# Patient Record
Sex: Female | Born: 1950 | Race: White | Hispanic: No | Marital: Married | Smoking: Former smoker
Health system: Southern US, Community
[De-identification: ages and names within clinical notes are randomized; demographics above are authoritative.]

## PROBLEM LIST (undated history)

## (undated) DIAGNOSIS — E782 Mixed hyperlipidemia: Secondary | ICD-10-CM

## (undated) DIAGNOSIS — M150 Primary generalized (osteo)arthritis: Secondary | ICD-10-CM

## (undated) DIAGNOSIS — C801 Malignant (primary) neoplasm, unspecified: Secondary | ICD-10-CM

## (undated) DIAGNOSIS — Z800 Family history of malignant neoplasm of digestive organs: Secondary | ICD-10-CM

## (undated) DIAGNOSIS — E669 Obesity, unspecified: Secondary | ICD-10-CM

## (undated) DIAGNOSIS — F419 Anxiety disorder, unspecified: Secondary | ICD-10-CM

## (undated) DIAGNOSIS — I10 Essential (primary) hypertension: Secondary | ICD-10-CM

## (undated) DIAGNOSIS — Z860100 Personal history of colon polyps, unspecified: Secondary | ICD-10-CM

## (undated) DIAGNOSIS — Z8719 Personal history of other diseases of the digestive system: Secondary | ICD-10-CM

## (undated) DIAGNOSIS — E559 Vitamin D deficiency, unspecified: Secondary | ICD-10-CM

## (undated) DIAGNOSIS — H2513 Age-related nuclear cataract, bilateral: Secondary | ICD-10-CM

## (undated) DIAGNOSIS — I6529 Occlusion and stenosis of unspecified carotid artery: Secondary | ICD-10-CM

## (undated) DIAGNOSIS — K219 Gastro-esophageal reflux disease without esophagitis: Secondary | ICD-10-CM

## (undated) DIAGNOSIS — M159 Polyosteoarthritis, unspecified: Secondary | ICD-10-CM

## (undated) HISTORY — DX: Anxiety disorder, unspecified: F41.9

## (undated) HISTORY — DX: Obesity, unspecified: E66.9

## (undated) HISTORY — DX: Family history of malignant neoplasm of digestive organs: Z80.0

## (undated) HISTORY — DX: Personal history of other diseases of the digestive system: Z87.19

## (undated) HISTORY — PX: UPPER GASTROINTESTINAL ENDOSCOPY: SHX188

## (undated) HISTORY — DX: Personal history of colon polyps, unspecified: Z86.0100

## (undated) HISTORY — PX: VAGINAL HYSTERECTOMY: SUR661

## (undated) HISTORY — DX: Polyosteoarthritis, unspecified: M15.9

## (undated) HISTORY — DX: Malignant (primary) neoplasm, unspecified: C80.1

## (undated) HISTORY — DX: Vitamin D deficiency, unspecified: E55.9

## (undated) HISTORY — DX: Essential (primary) hypertension: I10

## (undated) HISTORY — PX: APPENDECTOMY: SHX54

## (undated) HISTORY — DX: Gastro-esophageal reflux disease without esophagitis: K21.9

## (undated) HISTORY — PX: COLONOSCOPY: SHX174

## (undated) HISTORY — DX: Occlusion and stenosis of unspecified carotid artery: I65.29

## (undated) HISTORY — DX: Age-related nuclear cataract, bilateral: H25.13

## (undated) HISTORY — DX: Primary generalized (osteo)arthritis: M15.0

## (undated) HISTORY — DX: Personal history of colonic polyps: Z86.010

## (undated) HISTORY — DX: Mixed hyperlipidemia: E78.2

---

## 1997-11-08 ENCOUNTER — Other Ambulatory Visit: Admission: RE | Admit: 1997-11-08 | Discharge: 1997-11-08 | Payer: Self-pay | Admitting: Oncology

## 2004-04-07 HISTORY — PX: CHOLECYSTECTOMY: SHX55

## 2004-05-17 ENCOUNTER — Ambulatory Visit: Payer: Self-pay | Admitting: Oncology

## 2004-11-01 ENCOUNTER — Ambulatory Visit: Payer: Self-pay | Admitting: Oncology

## 2005-04-21 ENCOUNTER — Ambulatory Visit: Payer: Self-pay | Admitting: Oncology

## 2005-10-13 ENCOUNTER — Ambulatory Visit: Payer: Self-pay | Admitting: Oncology

## 2006-03-23 ENCOUNTER — Ambulatory Visit: Payer: Self-pay | Admitting: Oncology

## 2008-10-20 ENCOUNTER — Encounter: Payer: Self-pay | Admitting: Pulmonary Disease

## 2008-12-01 DIAGNOSIS — J310 Chronic rhinitis: Secondary | ICD-10-CM | POA: Insufficient documentation

## 2008-12-01 DIAGNOSIS — M1990 Unspecified osteoarthritis, unspecified site: Secondary | ICD-10-CM | POA: Insufficient documentation

## 2008-12-01 DIAGNOSIS — I10 Essential (primary) hypertension: Secondary | ICD-10-CM | POA: Insufficient documentation

## 2008-12-01 DIAGNOSIS — K219 Gastro-esophageal reflux disease without esophagitis: Secondary | ICD-10-CM

## 2008-12-01 DIAGNOSIS — E119 Type 2 diabetes mellitus without complications: Secondary | ICD-10-CM | POA: Insufficient documentation

## 2008-12-01 DIAGNOSIS — E785 Hyperlipidemia, unspecified: Secondary | ICD-10-CM

## 2008-12-01 DIAGNOSIS — R42 Dizziness and giddiness: Secondary | ICD-10-CM | POA: Insufficient documentation

## 2008-12-01 DIAGNOSIS — M7730 Calcaneal spur, unspecified foot: Secondary | ICD-10-CM | POA: Insufficient documentation

## 2008-12-01 DIAGNOSIS — F411 Generalized anxiety disorder: Secondary | ICD-10-CM | POA: Insufficient documentation

## 2008-12-01 DIAGNOSIS — M7660 Achilles tendinitis, unspecified leg: Secondary | ICD-10-CM | POA: Insufficient documentation

## 2008-12-04 ENCOUNTER — Ambulatory Visit: Payer: Self-pay | Admitting: Pulmonary Disease

## 2008-12-04 DIAGNOSIS — J984 Other disorders of lung: Secondary | ICD-10-CM

## 2008-12-04 DIAGNOSIS — C9140 Hairy cell leukemia not having achieved remission: Secondary | ICD-10-CM | POA: Insufficient documentation

## 2008-12-04 DIAGNOSIS — R05 Cough: Secondary | ICD-10-CM

## 2008-12-18 ENCOUNTER — Telehealth (INDEPENDENT_AMBULATORY_CARE_PROVIDER_SITE_OTHER): Payer: Self-pay | Admitting: *Deleted

## 2008-12-22 ENCOUNTER — Ambulatory Visit: Payer: Self-pay | Admitting: Pulmonary Disease

## 2009-01-05 ENCOUNTER — Ambulatory Visit: Payer: Self-pay | Admitting: Pulmonary Disease

## 2009-05-01 ENCOUNTER — Telehealth: Payer: Self-pay | Admitting: Pulmonary Disease

## 2009-05-11 ENCOUNTER — Encounter: Payer: Self-pay | Admitting: Pulmonary Disease

## 2009-05-17 ENCOUNTER — Encounter: Payer: Self-pay | Admitting: Pulmonary Disease

## 2009-08-27 HISTORY — PX: COLONOSCOPY: SHX5424

## 2009-08-27 HISTORY — PX: ESOPHAGOGASTRODUODENOSCOPY: SHX1529

## 2009-11-28 ENCOUNTER — Encounter: Payer: Self-pay | Admitting: Pulmonary Disease

## 2009-12-04 ENCOUNTER — Telehealth (INDEPENDENT_AMBULATORY_CARE_PROVIDER_SITE_OTHER): Payer: Self-pay | Admitting: *Deleted

## 2009-12-05 ENCOUNTER — Encounter: Payer: Self-pay | Admitting: Pulmonary Disease

## 2009-12-12 ENCOUNTER — Encounter: Payer: Self-pay | Admitting: Pulmonary Disease

## 2010-05-07 NOTE — Miscellaneous (Signed)
Summary: f/u ct chest stable.  Clinical Lists Changes  follow up ct chest shows no change in size of pulm nodules...completely stable from initial ct 10/2008 will check again in 6mos.  if stable, then one more at a year.  Appended Document: f/u ct chest stable. megan, please let pt know that ct chest completely stable.   good news....needs a f/u in 6mos.  call us in july to set up for august.  Appended Document: f/u ct chest stable. see above  Appended Document: f/u ct chest stable. Spoke with pt and made aware that ct chest completely stable and needs followup here in 6 months.  She will call in July to set up for August.

## 2010-05-07 NOTE — Progress Notes (Signed)
Summary: chest ct  Phone Note Call from Patient   Caller: Judith Matthews Call For: clance Summary of Call: pt said you told her to call end of dec to set up ct in jan 11 i need order and she wants it at [facility]  Initial call taken by: Oneita Jolly,  May 01, 2009 10:42 AM  Follow-up for Phone Call        ok.  will send order to [facility]. Follow-up by: Barbaraann Share MD,  May 02, 2009 5:32 PM

## 2010-05-07 NOTE — Miscellaneous (Signed)
Summary: Orders Update   Clinical Lists Changes  Orders: Added new Referral order of Radiology Referral (Radiology) - Signed 

## 2010-05-07 NOTE — Progress Notes (Signed)
Summary: signed order  Phone Note From Other Clinic Call back at [phone]   Caller: amanda/[facility] Call For: clance Summary of Call: States they received an order from [facility] with a stamped signature, needs to have his actual signature, needs it by 2p today in order to keep pt on schedule for tomorrow. Initial call taken by: Darletta Moll,  December 04, 2009 11:20 AM  Follow-up for Phone Call        order for CT chest sent by [facility].  will forward message to [facility] inbox. Boone Master CNA/MA  December 04, 2009 11:24 AM   Additional Follow-up for Phone Call Additional follow up Details #1::        spoke to [facility] that is dr clance original sig i had him to sign it myself she was ok with this Additional Follow-up by: Oneita Jolly,  December 04, 2009 11:27 AM

## 2010-05-07 NOTE — Miscellaneous (Signed)
Summary: ct chest 11/2009 stable nodules.  Clinical Lists Changes  ct chest 12/05/2009 shows stable nodules.  needs one more f/u in a year.  Appended Document: ct chest 11/2009 stable nodules. megan. please let pt know ct chest stable.  needs one more f/u in a year...please call next july for [place] to schedule in [place].  Appended Document: ct chest 11/2009 stable nodules. called and spoke with pt.  pt aware ct chest stable.  pt aware of KC's instructions to call back in 1 year (next July) to set up f/u ct appt. pt verbalized understanding and denied any questions.

## 2016-10-15 ENCOUNTER — Other Ambulatory Visit: Payer: Self-pay | Admitting: Specialist

## 2016-10-15 DIAGNOSIS — Z1231 Encounter for screening mammogram for malignant neoplasm of breast: Secondary | ICD-10-CM

## 2016-10-28 ENCOUNTER — Ambulatory Visit: Payer: Self-pay

## 2016-10-29 ENCOUNTER — Ambulatory Visit (INDEPENDENT_AMBULATORY_CARE_PROVIDER_SITE_OTHER): Payer: Medicare Other | Admitting: Orthopaedic Surgery

## 2016-10-29 DIAGNOSIS — M65341 Trigger finger, right ring finger: Secondary | ICD-10-CM | POA: Diagnosis not present

## 2016-10-29 MED ORDER — LIDOCAINE HCL 1 % IJ SOLN
1.0000 mL | INTRAMUSCULAR | Status: AC | PRN
  Administered 2016-10-29: 1 mL

## 2016-10-29 MED ORDER — METHYLPREDNISOLONE ACETATE 40 MG/ML IJ SUSP
40.0000 mg | INTRAMUSCULAR | Status: AC | PRN
  Administered 2016-10-29: 40 mg

## 2016-10-29 NOTE — Progress Notes (Signed)
   Office Visit Note   Patient: Judith Matthews           Date of Birth: 09-20-1950           MRN: [id number] Visit Date: 10/29/2016              Requested by: No referring provider defined for this encounter. PCP: System, Pcp Not In   Assessment & Plan: Visit Diagnoses:  1. Trigger finger, right ring finger     Plan: She was to consider 1 more injection I agree with this. We placed a steroid injection in the A1 pulley of her right middle finger combined with lidocaine. She'll follow-up as needed. However if this is still persistently giving her problems my next step would be an A1 pulley release with a long and thorough discussion about the surgery as well as the risks and benefits of it. All questions were encouraged and answered.  Follow-Up Instructions: Return if symptoms worsen or fail to improve.   Orders:  No orders of the defined types were placed in this encounter.  No orders of the defined types were placed in this encounter.     Procedures: Hand/UE Inj Date/Time: 10/29/2016 11:04 AM Performed by: Mcarthur Rossetti Authorized by: Mcarthur Rossetti   Condition: trigger finger   Location:  Ring finger Site:  R ring A1 Medications:  1 mL lidocaine 1 %; 40 mg methylPREDNISolone acetate 40 MG/ML     Clinical Data: No additional findings.   Subjective: No chief complaint on file. The patient comes in today with chief complaint of triggering of her right ring finger. 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 It triggers on a daily basis. She first had injection about 15 years ago helped for 15 years in the last year had another injection. Is back to triggering again and has have some pain in her right middle finger in the morning. Standing.  HPI  Review of Systems She denies any headache, chest pain, short of breath, fever, chills, nausea, vomiting.  Objective: Vital Signs: There were no vitals taken for this  visit.  Physical Exam He is alert and oriented 3 and in no acute distress Ortho Exam Emanation of her right hand does show pain over the A1 pulley of the ring finger. She is neurovascular intact otherwise. Her fingers ligamentously stable. Specialty Comments:  No specialty comments available.  Imaging: No results found.   PMFS History: Patient Active Problem List   Diagnosis Date Noted  . Trigger finger, right ring finger 10/29/2016  . LEUKEMIA, HAIRY CELL 12/04/2008  . PULMONARY NODULE 12/04/2008  . COUGH 12/04/2008  . DIABETES MELLITUS 12/01/2008  . HYPERLIPIDEMIA 12/01/2008  . OBESITY, MORBID 12/01/2008  . ANXIETY 12/01/2008  . HYPERTENSION 12/01/2008  . RHINITIS 12/01/2008  . GERD 12/01/2008  . OSTEOARTHRITIS 12/01/2008  . ACHILLES TENDINITIS 12/01/2008  . CALCANEAL SPUR 12/01/2008  . DIZZINESS 12/01/2008   No past medical history on file.  No family history on file.  No past surgical history on file. Social History   Occupational History  . Not on file.   Social History Main Topics  . Smoking status: Not on file  . Smokeless tobacco: Not on file  . Alcohol use Not on file  . Drug use: Unknown  . Sexual activity: Not on file

## 2016-10-30 ENCOUNTER — Ambulatory Visit
Admission: RE | Admit: 2016-10-30 | Discharge: 2016-10-30 | Disposition: A | Discharge status: Home / Self Care | Payer: Medicare Other | Source: Ambulatory Visit | Attending: Specialist | Admitting: Specialist

## 2016-10-30 ENCOUNTER — Encounter: Payer: Self-pay | Admitting: Radiology

## 2016-10-30 DIAGNOSIS — Z1231 Encounter for screening mammogram for malignant neoplasm of breast: Secondary | ICD-10-CM

## 2017-10-07 ENCOUNTER — Other Ambulatory Visit: Payer: Self-pay | Admitting: Specialist

## 2017-10-07 DIAGNOSIS — Z1231 Encounter for screening mammogram for malignant neoplasm of breast: Secondary | ICD-10-CM

## 2017-10-14 ENCOUNTER — Ambulatory Visit (INDEPENDENT_AMBULATORY_CARE_PROVIDER_SITE_OTHER): Payer: Medicare Other | Admitting: Orthopaedic Surgery

## 2017-10-14 ENCOUNTER — Encounter (INDEPENDENT_AMBULATORY_CARE_PROVIDER_SITE_OTHER): Payer: Self-pay | Admitting: Orthopaedic Surgery

## 2017-10-14 DIAGNOSIS — M65332 Trigger finger, left middle finger: Secondary | ICD-10-CM

## 2017-10-14 MED ORDER — METHYLPREDNISOLONE ACETATE 40 MG/ML IJ SUSP
40.0000 mg | INTRAMUSCULAR | Status: AC | PRN
  Administered 2017-10-14: 40 mg

## 2017-10-14 MED ORDER — LIDOCAINE HCL 1 % IJ SOLN
1.0000 mL | INTRAMUSCULAR | Status: AC | PRN
  Administered 2017-10-14: 1 mL

## 2017-10-14 NOTE — Progress Notes (Signed)
   Office Visit Note   Patient: Judith Matthews           Date of Birth: Aug 10, 1950           MRN: [id number] Visit Date: 10/14/2017              Requested by: No referring provider defined for this encounter. PCP: System, Pcp Not In   Assessment & Plan: Visit Diagnoses:  1. Trigger finger, left middle finger     Plan: I agree with trying a steroid injection in her left hand A1 pulley of the middle finger.  She understands this could elevate her blood glucose further and she can watch this closely.  All the risk and benefits of this were explained in detail.  She tolerated the injection well.  All questions concerns were answered and addressed.  Follow-up will be as needed.  Follow-Up Instructions: Return if symptoms worsen or fail to improve.   Orders:  No orders of the defined types were placed in this encounter.  No orders of the defined types were placed in this encounter.     Procedures: Hand/UE Inj: L long A1 for trigger finger on 10/14/2017 1:54 PM Medications: 1 mL lidocaine 1 %; 40 mg methylPREDNISolone acetate 40 MG/ML      Clinical Data: No additional findings.   Subjective: Chief Complaint  Patient presents with  . Left Hand - Pain  The patient is a 67 year old well-known to me.  She comes in the review issue.  She developed triggering of her left hand ring finger.  She is a diabetic and says her blood sugars have been running a little high.  She has had triggering before on her other hand and this resolved after steroid injection.  She would like to have one today in her left hand middle finger.  She denies any numbness and tingling or any injuries.  It does trigger at night when she gets up in the morning as well.  HPI  Review of Systems She currently denies any headache, chest pain, shortness of breath, fever, chills, nausea, vomiting.  Objective: Vital Signs: There were no vitals taken for this visit.  Physical Exam She is alert and oriented x3  and in no acute distress Ortho Exam Examination of her left hand shows pain over the A1 pulley with active triggering of the middle finger.  The remainder of her hand exam is normal.  Specialty Comments:  No specialty comments available.  Imaging: No results found.   PMFS History: Patient Active Problem List   Diagnosis Date Noted  . Trigger finger, right ring finger 10/29/2016  . LEUKEMIA, HAIRY CELL 12/04/2008  . PULMONARY NODULE 12/04/2008  . COUGH 12/04/2008  . DIABETES MELLITUS 12/01/2008  . HYPERLIPIDEMIA 12/01/2008  . OBESITY, MORBID 12/01/2008  . ANXIETY 12/01/2008  . HYPERTENSION 12/01/2008  . RHINITIS 12/01/2008  . GERD 12/01/2008  . OSTEOARTHRITIS 12/01/2008  . ACHILLES TENDINITIS 12/01/2008  . CALCANEAL SPUR 12/01/2008  . DIZZINESS 12/01/2008   History reviewed. No pertinent past medical history.  History reviewed. No pertinent family history.  History reviewed. No pertinent surgical history. Social History   Occupational History  . Not on file  Tobacco Use  . Smoking status: Not on file  Substance and Sexual Activity  . Alcohol use: Not on file  . Drug use: Not on file  . Sexual activity: Not on file

## 2017-11-02 ENCOUNTER — Ambulatory Visit
Admission: RE | Admit: 2017-11-02 | Discharge: 2017-11-02 | Disposition: A | Discharge status: Home / Self Care | Payer: Medicare Other | Source: Ambulatory Visit | Attending: Specialist | Admitting: Specialist

## 2017-11-02 DIAGNOSIS — Z1231 Encounter for screening mammogram for malignant neoplasm of breast: Secondary | ICD-10-CM

## 2019-01-19 ENCOUNTER — Other Ambulatory Visit: Payer: Self-pay | Admitting: Specialist

## 2019-01-19 DIAGNOSIS — Z1231 Encounter for screening mammogram for malignant neoplasm of breast: Secondary | ICD-10-CM

## 2019-03-09 ENCOUNTER — Other Ambulatory Visit: Payer: Self-pay

## 2019-03-09 ENCOUNTER — Ambulatory Visit
Admission: RE | Admit: 2019-03-09 | Discharge: 2019-03-09 | Disposition: A | Discharge status: Home / Self Care | Payer: Medicare Other | Source: Ambulatory Visit | Attending: Specialist | Admitting: Specialist

## 2019-03-09 DIAGNOSIS — Z1231 Encounter for screening mammogram for malignant neoplasm of breast: Secondary | ICD-10-CM

## 2019-06-09 DIAGNOSIS — D72819 Decreased white blood cell count, unspecified: Secondary | ICD-10-CM

## 2019-06-09 DIAGNOSIS — C9141 Hairy cell leukemia, in remission: Secondary | ICD-10-CM | POA: Diagnosis not present

## 2019-06-09 DIAGNOSIS — D6959 Other secondary thrombocytopenia: Secondary | ICD-10-CM

## 2020-01-27 ENCOUNTER — Other Ambulatory Visit: Payer: Self-pay | Admitting: Specialist

## 2020-01-27 DIAGNOSIS — Z1231 Encounter for screening mammogram for malignant neoplasm of breast: Secondary | ICD-10-CM

## 2020-03-12 ENCOUNTER — Other Ambulatory Visit: Payer: Self-pay

## 2020-03-12 ENCOUNTER — Ambulatory Visit
Admission: RE | Admit: 2020-03-12 | Discharge: 2020-03-12 | Disposition: A | Discharge status: Home / Self Care | Payer: Medicare Other | Source: Ambulatory Visit | Attending: Specialist | Admitting: Specialist

## 2020-03-12 DIAGNOSIS — Z1231 Encounter for screening mammogram for malignant neoplasm of breast: Secondary | ICD-10-CM

## 2020-08-06 DIAGNOSIS — Z856 Personal history of leukemia: Secondary | ICD-10-CM | POA: Diagnosis not present

## 2020-08-06 DIAGNOSIS — K219 Gastro-esophageal reflux disease without esophagitis: Secondary | ICD-10-CM | POA: Diagnosis not present

## 2020-08-06 DIAGNOSIS — F330 Major depressive disorder, recurrent, mild: Secondary | ICD-10-CM | POA: Diagnosis not present

## 2020-08-06 DIAGNOSIS — I10 Essential (primary) hypertension: Secondary | ICD-10-CM | POA: Diagnosis not present

## 2020-08-06 DIAGNOSIS — Z6837 Body mass index (BMI) 37.0-37.9, adult: Secondary | ICD-10-CM | POA: Diagnosis not present

## 2020-08-06 DIAGNOSIS — Z7984 Long term (current) use of oral hypoglycemic drugs: Secondary | ICD-10-CM | POA: Diagnosis not present

## 2020-08-06 DIAGNOSIS — Z79899 Other long term (current) drug therapy: Secondary | ICD-10-CM | POA: Diagnosis not present

## 2020-08-06 DIAGNOSIS — E782 Mixed hyperlipidemia: Secondary | ICD-10-CM | POA: Diagnosis not present

## 2020-08-06 DIAGNOSIS — E119 Type 2 diabetes mellitus without complications: Secondary | ICD-10-CM | POA: Diagnosis not present

## 2020-08-06 DIAGNOSIS — E559 Vitamin D deficiency, unspecified: Secondary | ICD-10-CM | POA: Diagnosis not present

## 2020-08-06 DIAGNOSIS — F419 Anxiety disorder, unspecified: Secondary | ICD-10-CM | POA: Diagnosis not present

## 2020-08-06 DIAGNOSIS — E538 Deficiency of other specified B group vitamins: Secondary | ICD-10-CM | POA: Diagnosis not present

## 2020-08-06 DIAGNOSIS — Z0000 Encounter for general adult medical examination without abnormal findings: Secondary | ICD-10-CM | POA: Diagnosis not present

## 2020-08-06 DIAGNOSIS — Z87891 Personal history of nicotine dependence: Secondary | ICD-10-CM | POA: Diagnosis not present

## 2020-08-06 DIAGNOSIS — I872 Venous insufficiency (chronic) (peripheral): Secondary | ICD-10-CM | POA: Diagnosis not present

## 2020-08-06 DIAGNOSIS — M159 Polyosteoarthritis, unspecified: Secondary | ICD-10-CM | POA: Diagnosis not present

## 2020-08-08 DIAGNOSIS — E538 Deficiency of other specified B group vitamins: Secondary | ICD-10-CM | POA: Diagnosis not present

## 2020-09-10 DIAGNOSIS — E538 Deficiency of other specified B group vitamins: Secondary | ICD-10-CM | POA: Diagnosis not present

## 2020-10-11 DIAGNOSIS — E538 Deficiency of other specified B group vitamins: Secondary | ICD-10-CM | POA: Diagnosis not present

## 2020-11-12 DIAGNOSIS — E538 Deficiency of other specified B group vitamins: Secondary | ICD-10-CM | POA: Diagnosis not present

## 2020-11-26 DIAGNOSIS — M65331 Trigger finger, right middle finger: Secondary | ICD-10-CM | POA: Diagnosis not present

## 2020-12-13 DIAGNOSIS — Z23 Encounter for immunization: Secondary | ICD-10-CM | POA: Diagnosis not present

## 2020-12-13 DIAGNOSIS — E538 Deficiency of other specified B group vitamins: Secondary | ICD-10-CM | POA: Diagnosis not present

## 2021-01-14 DIAGNOSIS — E538 Deficiency of other specified B group vitamins: Secondary | ICD-10-CM | POA: Diagnosis not present

## 2021-02-07 DIAGNOSIS — E782 Mixed hyperlipidemia: Secondary | ICD-10-CM | POA: Diagnosis not present

## 2021-02-07 DIAGNOSIS — Z856 Personal history of leukemia: Secondary | ICD-10-CM | POA: Diagnosis not present

## 2021-02-07 DIAGNOSIS — M159 Polyosteoarthritis, unspecified: Secondary | ICD-10-CM | POA: Diagnosis not present

## 2021-02-07 DIAGNOSIS — R7401 Elevation of levels of liver transaminase levels: Secondary | ICD-10-CM | POA: Diagnosis not present

## 2021-02-07 DIAGNOSIS — F419 Anxiety disorder, unspecified: Secondary | ICD-10-CM | POA: Diagnosis not present

## 2021-02-07 DIAGNOSIS — K1379 Other lesions of oral mucosa: Secondary | ICD-10-CM | POA: Diagnosis not present

## 2021-02-07 DIAGNOSIS — Z6837 Body mass index (BMI) 37.0-37.9, adult: Secondary | ICD-10-CM | POA: Diagnosis not present

## 2021-02-07 DIAGNOSIS — K219 Gastro-esophageal reflux disease without esophagitis: Secondary | ICD-10-CM | POA: Diagnosis not present

## 2021-02-07 DIAGNOSIS — I872 Venous insufficiency (chronic) (peripheral): Secondary | ICD-10-CM | POA: Diagnosis not present

## 2021-02-07 DIAGNOSIS — R799 Abnormal finding of blood chemistry, unspecified: Secondary | ICD-10-CM | POA: Diagnosis not present

## 2021-02-07 DIAGNOSIS — E119 Type 2 diabetes mellitus without complications: Secondary | ICD-10-CM | POA: Diagnosis not present

## 2021-02-07 DIAGNOSIS — I10 Essential (primary) hypertension: Secondary | ICD-10-CM | POA: Diagnosis not present

## 2021-02-08 ENCOUNTER — Other Ambulatory Visit: Payer: Self-pay | Admitting: Specialist

## 2021-02-08 DIAGNOSIS — Z1231 Encounter for screening mammogram for malignant neoplasm of breast: Secondary | ICD-10-CM

## 2021-02-14 DIAGNOSIS — E538 Deficiency of other specified B group vitamins: Secondary | ICD-10-CM | POA: Diagnosis not present

## 2021-02-26 ENCOUNTER — Encounter: Payer: Self-pay | Admitting: Gastroenterology

## 2021-03-08 ENCOUNTER — Telehealth: Payer: Self-pay

## 2021-03-08 NOTE — Telephone Encounter (Signed)
LVM for patient to call back  She needs to reschedule her appointment due to schedule conflict.

## 2021-03-11 DIAGNOSIS — R059 Cough, unspecified: Secondary | ICD-10-CM | POA: Diagnosis not present

## 2021-03-11 DIAGNOSIS — J40 Bronchitis, not specified as acute or chronic: Secondary | ICD-10-CM | POA: Diagnosis not present

## 2021-03-11 DIAGNOSIS — Z20822 Contact with and (suspected) exposure to covid-19: Secondary | ICD-10-CM | POA: Diagnosis not present

## 2021-03-11 NOTE — Telephone Encounter (Signed)
Patient rescheduled for 12-29

## 2021-03-21 ENCOUNTER — Ambulatory Visit
Admission: RE | Admit: 2021-03-21 | Discharge: 2021-03-21 | Disposition: A | Discharge status: Home / Self Care | Payer: PPO | Source: Ambulatory Visit | Attending: Specialist | Admitting: Specialist

## 2021-03-21 DIAGNOSIS — Z1231 Encounter for screening mammogram for malignant neoplasm of breast: Secondary | ICD-10-CM | POA: Diagnosis not present

## 2021-03-26 DIAGNOSIS — E538 Deficiency of other specified B group vitamins: Secondary | ICD-10-CM | POA: Diagnosis not present

## 2021-03-29 ENCOUNTER — Ambulatory Visit: Payer: Medicare Other | Admitting: Gastroenterology

## 2021-04-03 ENCOUNTER — Other Ambulatory Visit: Payer: Self-pay

## 2021-04-04 ENCOUNTER — Encounter: Payer: Self-pay | Admitting: Gastroenterology

## 2021-04-04 ENCOUNTER — Ambulatory Visit (INDEPENDENT_AMBULATORY_CARE_PROVIDER_SITE_OTHER): Payer: PPO | Admitting: Gastroenterology

## 2021-04-04 ENCOUNTER — Other Ambulatory Visit: Payer: Self-pay

## 2021-04-04 VITALS — BP 160/82 | HR 70 | Ht 63.5 in | Wt 205.4 lb

## 2021-04-04 DIAGNOSIS — R1010 Upper abdominal pain, unspecified: Secondary | ICD-10-CM | POA: Diagnosis not present

## 2021-04-04 DIAGNOSIS — K580 Irritable bowel syndrome with diarrhea: Secondary | ICD-10-CM | POA: Diagnosis not present

## 2021-04-04 DIAGNOSIS — Z800 Family history of malignant neoplasm of digestive organs: Secondary | ICD-10-CM | POA: Diagnosis not present

## 2021-04-04 DIAGNOSIS — K219 Gastro-esophageal reflux disease without esophagitis: Secondary | ICD-10-CM | POA: Diagnosis not present

## 2021-04-04 MED ORDER — PANTOPRAZOLE SODIUM 40 MG PO TBEC: 40.0000 mg | DELAYED_RELEASE_TABLET | Freq: Every day | ORAL | 3 refills | Status: DC

## 2021-04-04 NOTE — Progress Notes (Signed)
Chief Complaint: For GI work-up  Referring Provider:  No ref. provider found      ASSESSMENT AND PLAN;   #1. GERD with upper abdo pain. S/P lap chole.  #2. FH colon ca (sister at age 71)  #3. Diarrhea. 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 -Nexium to protonix 40mg  po QD #30, 11 RF -If still with problems, [place] followed by CT AP -Check CBC, CMP, lipase at the time of above procedures.    HPI:    Judith Matthews is a 70 y.o. female  With DM2, HTN, anxiety  With Upper abdo pain despite OTC Nexium without any N/V.  Gets worse after eating.  No odynophagia or dysphagia.  No nonsteroidals.  No fever chills or night sweats.  No jaundice dark urine or pale stools.  Occ diarrhea ever since lap chole and while being on metformin.  She is due for repeat colonoscopy.  No weight loss or loss of appetite.  Past GI procedures: EGD 08/2009 -Antral polyp s/p polypectomy. Bx-foveolar cell hyperplasia -Mild gastritis. Neg HP  Colonoscopy 08/2009 ([facility]) -Mild pancolonic diverticulosis -Small internal hemorrhoids -Repeat in 5 years d/t FH  CT 06/2008: Acute appendicitis, hepatic steatosis.  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   Past Medical History:  Diagnosis Date   Family history of colon cancer    GERD (gastroesophageal reflux disease)    with chronic cough   History of colon polyps    History of GI bleed    History of hemorrhoids    Obesity     Past Surgical History:  Procedure Laterality Date   APPENDECTOMY     CHOLECYSTECTOMY  2006   laparoscopic due to stones   COLONOSCOPY  08/27/2009   Mild pancolonic diverticulosis. Small internal hemorrhoids.   ESOPHAGOGASTRODUODENOSCOPY  08/27/2009   Antral polyp status post polypectomy. Mild gastritis   VAGINAL HYSTERECTOMY      Family History  Problem Relation Age of Onset   COPD Mother    Diabetes Mother    Heart disease Mother    COPD Father    Colon cancer  Sister    Rectal cancer Neg Hx    Stomach cancer Neg Hx        Current Outpatient Medications  Medication Sig Dispense Refill   busPIRone (BUSPAR) 5 MG tablet Take 5 mg by mouth 2 (two) times daily.     cyanocobalamin (,VITAMIN B-12,) 1000 MCG/ML injection Inject into the muscle.     dexamethasone (DECADRON) 0.5 MG/5ML solution Take by mouth.     ergocalciferol (VITAMIN D2) 1.25 MG (50000 UT) capsule TAKE 1 CAPSULE BY MOUTH TWICE A WEEK     losartan (COZAAR) 50 MG tablet Take 1 tablet by mouth daily.     metFORMIN (GLUCOPHAGE-XR) 500 MG 24 hr tablet Take 500 mg by mouth 2 (two) times daily.     sitaGLIPtin (JANUVIA) 100 MG tablet Take 1 tablet by mouth daily.     No current facility-administered medications for this visit.    Allergies  Allergen Reactions   Senna-Lax     REACTION: nausea, vomiting    Review of Systems:  Constitutional: Denies fever, chills, diaphoresis, appetite change and fatigue.  HEENT: Denies photophobia, eye pain, redness, hearing loss, ear pain, congestion, sore throat, rhinorrhea, sneezing, mouth sores, neck pain, neck stiffness and tinnitus.   Respiratory: Denies SOB, DOE, cough, chest tightness,  and wheezing.   Cardiovascular: Denies chest pain, palpitations and  leg swelling.  Genitourinary: Denies dysuria, urgency, frequency, hematuria, flank pain and difficulty urinating.  Musculoskeletal: Denies myalgias, back pain, joint swelling, arthralgias and gait problem.  Skin: No rash.  Neurological: Denies dizziness, seizures, syncope, weakness, light-headedness, numbness and headaches.  Hematological: Denies adenopathy. Easy bruising, personal or family bleeding history  Psychiatric/Behavioral: Has anxiety or depression     Physical Exam:    BP (!) 160/82    Pulse 70    Ht 5' 3.5" (1.613 m)    Wt 205 lb 6 oz (93.2 kg)    SpO2 98%    BMI 35.81 kg/m  Wt Readings from Last 3 Encounters:  04/04/21 205 lb 6 oz (93.2 kg)   Constitutional:   Well-developed, in no acute distress. Psychiatric: Normal mood and affect. Behavior is normal. HEENT: Pupils normal.  Conjunctivae are normal. No scleral icterus. Neck supple.  Cardiovascular: Normal rate, regular rhythm. No edema Pulmonary/chest: Effort normal and breath sounds normal. No wheezing, rales or rhonchi. Abdominal: Soft, nondistended. Nontender. Bowel sounds active throughout. There are no masses palpable. No hepatomegaly. Rectal: Deferred Neurological: Alert and oriented to person place and time. Skin: Skin is warm and dry. No rashes noted.  Data Reviewed: I have personally reviewed following labs and imaging studies   Radiology Studies: MM 3D SCREEN BREAST BILATERAL  Result Date: 03/21/2021 CLINICAL DATA:  Screening. EXAM: DIGITAL SCREENING BILATERAL MAMMOGRAM WITH TOMOSYNTHESIS AND CAD TECHNIQUE: Bilateral screening digital craniocaudal and mediolateral oblique mammograms were obtained. Bilateral screening digital breast tomosynthesis was performed. The images were evaluated with computer-aided detection. COMPARISON:  Previous exam(s). ACR Breast Density Category b: There are scattered areas of fibroglandular density. FINDINGS: There are no findings suspicious for malignancy. IMPRESSION: No mammographic evidence of malignancy. A result letter of this screening mammogram will be mailed directly to the patient. RECOMMENDATION: Screening mammogram in one year. (Code:SM-B-01Y) BI-RADS CATEGORY  1: Negative. Electronically Signed   By: Lajean Manes M.D.   On: 03/21/2021 14:21      Carmell Austria, MD 04/04/2021, 11:19 AM  Cc: No ref. provider found

## 2021-04-04 NOTE — Patient Instructions (Addendum)
If you are age 71 or older, your body mass index should be between 23-30. Your Body mass index is 35.81 kg/m. If this is out of the aforementioned range listed, please consider follow up with your Primary Care Provider.  If you are age 31 or younger, your body mass index should be between 19-25. Your Body mass index is 35.81 kg/m. If this is out of the aformentioned range listed, please consider follow up with your Primary Care Provider.   ________________________________________________________  The [facility] providers would like to encourage you to use [facility] to communicate with providers for non-urgent requests or questions.  Due to long hold times on the telephone, sending your provider a message by [facility] may be a faster and more efficient way to get a response.  Please allow 48 business hours for a response.  Please remember that this is for non-urgent requests.  _______________________________________________________  Judith Matthews have been scheduled for an endoscopy and colonoscopy. Please follow the written instructions given to you at your visit today. Please pick up your prep supplies at the pharmacy within the next 1-3 days. If you use inhalers (even only as needed), please bring them with you on the day of your procedure.  We have sent the following medications to your pharmacy for you to pick up at your convenience: Protonix  Stop nexium  Thank you,  Dr. Jackquline Denmark

## 2021-05-21 ENCOUNTER — Encounter: Payer: Self-pay | Admitting: Gastroenterology

## 2021-05-21 ENCOUNTER — Ambulatory Visit (AMBULATORY_SURGERY_CENTER): Payer: PPO | Admitting: Gastroenterology

## 2021-05-21 VITALS — BP 154/86 | HR 76 | Temp 98.4°F | Resp 20 | Ht 63.5 in | Wt 205.0 lb

## 2021-05-21 DIAGNOSIS — K2970 Gastritis, unspecified, without bleeding: Secondary | ICD-10-CM | POA: Diagnosis not present

## 2021-05-21 DIAGNOSIS — D123 Benign neoplasm of transverse colon: Secondary | ICD-10-CM

## 2021-05-21 DIAGNOSIS — Z800 Family history of malignant neoplasm of digestive organs: Secondary | ICD-10-CM

## 2021-05-21 DIAGNOSIS — K219 Gastro-esophageal reflux disease without esophagitis: Secondary | ICD-10-CM | POA: Diagnosis not present

## 2021-05-21 DIAGNOSIS — K5730 Diverticulosis of large intestine without perforation or abscess without bleeding: Secondary | ICD-10-CM | POA: Diagnosis not present

## 2021-05-21 DIAGNOSIS — D125 Benign neoplasm of sigmoid colon: Secondary | ICD-10-CM | POA: Diagnosis not present

## 2021-05-21 DIAGNOSIS — K635 Polyp of colon: Secondary | ICD-10-CM | POA: Diagnosis not present

## 2021-05-21 DIAGNOSIS — K640 First degree hemorrhoids: Secondary | ICD-10-CM

## 2021-05-21 DIAGNOSIS — R197 Diarrhea, unspecified: Secondary | ICD-10-CM

## 2021-05-21 MED ORDER — SODIUM CHLORIDE 0.9 % IV SOLN: 500.0000 mL | Freq: Once | INTRAVENOUS | Status: DC

## 2021-05-21 NOTE — Progress Notes (Signed)
1525 HR > 100 with esmolol 25 mg given IV.  Patient experiencing nausea.  MD updated and Zofran 4 mg IV given, vss

## 2021-05-21 NOTE — Op Note (Signed)
[name] Patient Name: Judith Matthews Procedure Date: 05/21/2021 3:00 PM MRN: [id number] Endoscopist: Jackquline Denmark , MD Age: 71 Referring MD:  Date of Birth: [birth year] Gender: Female Account #: [id number] Procedure:                Upper GI endoscopy Indications:              Epigastric abdominal pain. GERD. Medicines:                Monitored Anesthesia Care Procedure:                Pre-Anesthesia Assessment:                           - Prior to the procedure, a History and Physical                            was performed, and patient medications and                            allergies were reviewed. The patient's tolerance of                            previous anesthesia was also reviewed. The risks                            and benefits of the procedure and the sedation                            options and risks were discussed with the patient.                            All questions were answered, and informed consent                            was obtained. Prior Anticoagulants: The patient has                            taken no previous anticoagulant or antiplatelet                            agents. ASA Grade Assessment: II - A patient with                            mild systemic disease. After reviewing the risks                            and benefits, the patient was deemed in                            satisfactory condition to undergo the procedure.                           After obtaining informed consent, the endoscope was  passed under direct vision. Throughout the                            procedure, the patient's blood pressure, pulse, and                            oxygen saturations were monitored continuously. The                            Endoscope was introduced through the mouth, and                            advanced to the second part of duodenum. The upper                            GI endoscopy was  accomplished without difficulty.                            The patient tolerated the procedure well. Scope In: Scope Out: Findings:                 The examined esophagus was normal, with                            well-defined Z-line at 38 cm, examined by NBI.                           Localized mild inflammation characterized by                            erythema and granularity was found in the gastric                            antrum. Biopsies were taken with a cold forceps for                            histology. 4-5 small 4 to 6 mm gastric polyps were                            noted (previously deemed to be hyperplastic). These                            were not removed.                           The examined duodenum was normal. Biopsies for                            histology were taken with a cold forceps for                            evaluation of celiac disease. Complications:            No immediate complications. Estimated Blood Loss:     Estimated blood loss: none.  Impression:               - Mild gastritis. Recommendation:           - Patient has a contact number available for                            emergencies. The signs and symptoms of potential                            delayed complications were discussed with the                            patient. Return to normal activities tomorrow.                            Written discharge instructions were provided to the                            patient.                           - Resume previous diet.                           - Continue present medications.                           - Await pathology results.                           - The findings and recommendations were discussed                            with the patient. Jackquline Denmark, MD 05/21/2021 3:10:13 PM This report has been signed electronically.

## 2021-05-21 NOTE — Patient Instructions (Signed)
Handouts Provided:  Gastritis, Polyps and Diverticulosis  YOU HAD AN ENDOSCOPIC PROCEDURE TODAY AT [facility]:   Refer to the procedure report that was given to you for any specific questions about what was found during the examination.  If the procedure report does not answer your questions, please call your gastroenterologist to clarify.  If you requested that your care partner not be given the details of your procedure findings, then the procedure report has been included in a sealed envelope for you to review at your convenience later.  YOU SHOULD EXPECT: Some feelings of bloating in the abdomen. Passage of more gas than usual.  Walking can help get rid of the air that was put into your GI tract during the procedure and reduce the bloating. If you had a lower endoscopy (such as a colonoscopy or flexible sigmoidoscopy) you may notice spotting of blood in your stool or on the toilet paper. If you underwent a bowel prep for your procedure, you may not have a normal bowel movement for a few days.  Please Note:  You might notice some irritation and congestion in your nose or some drainage.  This is from the oxygen used during your procedure.  There is no need for concern and it should clear up in a day or so.  SYMPTOMS TO REPORT IMMEDIATELY:  Following lower endoscopy (colonoscopy or flexible sigmoidoscopy):  Excessive amounts of blood in the stool  Significant tenderness or worsening of abdominal pains  Swelling of the abdomen that is new, acute  Fever of 100F or higher  Following upper endoscopy (EGD)  Vomiting of blood or coffee ground material  New chest pain or pain under the shoulder blades  Painful or persistently difficult swallowing  New shortness of breath  Fever of 100F or higher  Black, tarry-looking stools  For urgent or emergent issues, a gastroenterologist can be reached at any hour by calling [phone]. Do not use MyChart messaging for urgent concerns.     DIET:  We do recommend a small meal at first, but then you may proceed to your regular diet.  Drink plenty of fluids but you should avoid alcoholic beverages for 24 hours.  ACTIVITY:  You should plan to take it easy for the rest of today and you should NOT DRIVE or use heavy machinery until tomorrow (because of the sedation medicines used during the test).    FOLLOW UP: Our staff will call the number listed on your records 48-72 hours following your procedure to check on you and address any questions or concerns that you may have regarding the information given to you following your procedure. If we do not reach you, we will leave a message.  We will attempt to reach you two times.  During this call, we will ask if you have developed any symptoms of COVID 19. If you develop any symptoms (ie: fever, flu-like symptoms, shortness of breath, cough etc.) before then, please call [phone].  If you test positive for Covid 19 in the 2 weeks post procedure, please call and report this information to [place].    If any biopsies were taken you will be contacted by phone or by letter within the next 1-3 weeks.  Please call us at [phone] if you have not heard about the biopsies in 3 weeks.    SIGNATURES/CONFIDENTIALITY: You and/or your care partner have signed paperwork which will be entered into your electronic medical record.  These signatures attest to the fact  that that the information above on your After Visit Summary has been reviewed and is understood.  Full responsibility of the confidentiality of this discharge information lies with you and/or your care-partner.

## 2021-05-21 NOTE — Progress Notes (Signed)
Chief Complaint: For GI work-up  Referring Provider:  Jackquline Denmark, MD      ASSESSMENT AND PLAN;   #1. GERD with upper abdo pain. S/P lap chole.  #2. FH colon ca (sister at age 72)  #3. Diarrhea. 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 -Nexium to protonix 40mg  po QD #30, 11 RF -If still with problems, [place] followed by CT AP -Check CBC, CMP, lipase at the time of above procedures.    HPI:    Judith Matthews is a 71 y.o. female  With DM2, HTN, anxiety  With Upper abdo pain despite OTC Nexium without any N/V.  Gets worse after eating.  No odynophagia or dysphagia.  No nonsteroidals.  No fever chills or night sweats.  No jaundice dark urine or pale stools.  Occ diarrhea ever since lap chole and while being on metformin.  She is due for repeat colonoscopy.  No weight loss or loss of appetite.  Past GI procedures: EGD 08/2009 -Antral polyp s/p polypectomy. Bx-foveolar cell hyperplasia -Mild gastritis. Neg HP  Colonoscopy 08/2009 ([facility]) -Mild pancolonic diverticulosis -Small internal hemorrhoids -Repeat in 5 years d/t FH  CT 06/2008: Acute appendicitis, hepatic steatosis.  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   Past Medical History:  Diagnosis Date   Anxiety    Cancer ([place])    leukemia   Essential hypertension    Family history of colon cancer    GERD (gastroesophageal reflux disease)    with chronic cough   History of colon polyps    History of GI bleed    History of hemorrhoids    Mixed hyperlipidemia    Nuclear sclerosis of both eyes    Obesity    Primary osteoarthritis involving multiple joints    Vitamin D deficiency     Past Surgical History:  Procedure Laterality Date   APPENDECTOMY     CHOLECYSTECTOMY  2006   laparoscopic due to stones   COLONOSCOPY  08/27/2009   Mild pancolonic diverticulosis. Small internal hemorrhoids.   COLONOSCOPY     ESOPHAGOGASTRODUODENOSCOPY  08/27/2009   Antral  polyp status post polypectomy. Mild gastritis   UPPER GASTROINTESTINAL ENDOSCOPY     VAGINAL HYSTERECTOMY      Family History  Problem Relation Age of Onset   COPD Mother    Diabetes Mother    Heart disease Mother    COPD Father    Colon cancer Sister    Rectal cancer Neg Hx    Stomach cancer Neg Hx    Esophageal cancer Neg Hx     Social History   Tobacco Use   Smoking status: Former    Types: Cigarettes   Smokeless tobacco: Never  Vaping Use   Vaping Use: Never used  Substance Use Topics   Alcohol use: Not Currently   Drug use: Never    Current Outpatient Medications  Medication Sig Dispense Refill   busPIRone (BUSPAR) 5 MG tablet Take 5 mg by mouth 2 (two) times daily.     celecoxib (CELEBREX) 200 MG capsule Take by mouth daily.     ergocalciferol (VITAMIN D2) 1.25 MG (50000 UT) capsule TAKE 1 CAPSULE BY MOUTH TWICE A WEEK     losartan (COZAAR) 50 MG tablet Take 1 tablet by mouth daily.     metFORMIN (GLUCOPHAGE-XR) 500 MG 24 hr tablet Take 500 mg by mouth 2 (two) times daily.     Omega-3 Fatty Acids (FISH OIL  PO) Take by mouth 2 (two) times daily.     pantoprazole (PROTONIX) 40 MG tablet Take 1 tablet (40 mg total) by mouth daily. 90 tablet 3   sitaGLIPtin (JANUVIA) 100 MG tablet Take 1 tablet by mouth daily.     cyanocobalamin (,VITAMIN B-12,) 1000 MCG/ML injection Inject into the muscle.     dexamethasone (DECADRON) 0.5 MG/5ML solution Take by mouth.     Current Facility-Administered Medications  Medication Dose Route Frequency Provider Last Rate Last Admin   0.9 %  sodium chloride infusion  500 mL Intravenous Once Jackquline Denmark, MD        Allergies  Allergen Reactions   Senna-Lax     REACTION: nausea, vomiting    Review of Systems:  Constitutional: Denies fever, chills, diaphoresis, appetite change and fatigue.  HEENT: Denies photophobia, eye pain, redness, hearing loss, ear pain, congestion, sore throat, rhinorrhea, sneezing, mouth sores, neck pain, neck  stiffness and tinnitus.   Respiratory: Denies SOB, DOE, cough, chest tightness,  and wheezing.   Cardiovascular: Denies chest pain, palpitations and leg swelling.  Genitourinary: Denies dysuria, urgency, frequency, hematuria, flank pain and difficulty urinating.  Musculoskeletal: Denies myalgias, back pain, joint swelling, arthralgias and gait problem.  Skin: No rash.  Neurological: Denies dizziness, seizures, syncope, weakness, light-headedness, numbness and headaches.  Hematological: Denies adenopathy. Easy bruising, personal or family bleeding history  Psychiatric/Behavioral: Has anxiety or depression     Physical Exam:    BP (!) 163/83    Pulse 87    Temp 98.4 F (36.9 C)    Ht 5' 3.5" (1.613 m)    Wt 205 lb (93 kg)    SpO2 98%    BMI 35.74 kg/m  Wt Readings from Last 3 Encounters:  05/21/21 205 lb (93 kg)  04/04/21 205 lb 6 oz (93.2 kg)   Constitutional:  Well-developed, in no acute distress. Psychiatric: Normal mood and affect. Behavior is normal. HEENT: Pupils normal.  Conjunctivae are normal. No scleral icterus. Neck supple.  Cardiovascular: Normal rate, regular rhythm. No edema Pulmonary/chest: Effort normal and breath sounds normal. No wheezing, rales or rhonchi. Abdominal: Soft, nondistended. Nontender. Bowel sounds active throughout. There are no masses palpable. No hepatomegaly. Rectal: Deferred Neurological: Alert and oriented to person place and time. Skin: Skin is warm and dry. No rashes noted.  Data Reviewed: I have personally reviewed following labs and imaging studies   Radiology Studies: No results found.    Carmell Austria, MD 05/21/2021, 2:56 PM  Cc: Jackquline Denmark, MD

## 2021-05-21 NOTE — Progress Notes (Signed)
Report given to PACU, vss 

## 2021-05-21 NOTE — Progress Notes (Signed)
Called to room to assist during endoscopic procedure.  Patient ID and intended procedure confirmed with present staff. Received instructions for my participation in the procedure from the performing physician.  

## 2021-05-21 NOTE — Progress Notes (Signed)
VS- Judith Matthews. °

## 2021-05-21 NOTE — Progress Notes (Signed)
1458 Robinul 0.1 mg IV given due large amount of secretions upon assessment.  MD made aware, vss

## 2021-05-21 NOTE — Progress Notes (Signed)
1500 HR > 100 with esmolol 25 mg given IV, MD updated, vss  

## 2021-05-21 NOTE — Op Note (Signed)
[facility] Patient Name: Judith Matthews Procedure Date: 05/21/2021 2:35 PM MRN: [id number] Endoscopist: Jackquline Denmark , MD Age: 71 Referring MD:  Date of Birth: 1950-07-12 Gender: Female Account #: [id number] Procedure:                Colonoscopy Indications:              Screening in patient at increased risk: Colorectal                            cancer in sister at age 9. H/O diarrhea. Medicines:                Monitored Anesthesia Care Procedure:                Pre-Anesthesia Assessment:                           - Prior to the procedure, a History and Physical                            was performed, and patient medications and                            allergies were reviewed. The patient's tolerance of                            previous anesthesia was also reviewed. The risks                            and benefits of the procedure and the sedation                            options and risks were discussed with the patient.                            All questions were answered, and informed consent                            was obtained. Prior Anticoagulants: The patient has                            taken no previous anticoagulant or antiplatelet                            agents. ASA Grade Assessment: II - A patient with                            mild systemic disease. After reviewing the risks                            and benefits, the patient was deemed in                            satisfactory condition to undergo the procedure.  After obtaining informed consent, the colonoscope                            was passed under direct vision. Throughout the                            procedure, the patient's blood pressure, pulse, and                            oxygen saturations were monitored continuously. The                            [id number]-HQ190L Colonoscope was introduced through the                            anus and advanced to  the the cecum, identified by                            appendiceal orifice and ileocecal valve. The                            colonoscopy was somewhat difficult due to a                            tortuous colon. Successful completion of the                            procedure was aided by applying abdominal pressure.                            The patient tolerated the procedure well. The                            quality of the bowel preparation was good. The                            ileocecal valve, appendiceal orifice, and rectum                            were photographed. Scope In: 3:11:47 PM Scope Out: 3:41:48 PM Scope Withdrawal Time: 0 hours 15 minutes 48 seconds  Total Procedure Duration: 0 hours 30 minutes 1 second  Findings:                 Five sessile polyps were found in the proximal                            sigmoid colon, proximal transverse colon and mid                            transverse colon. The polyps were 4 to 8 mm in                            size. These polyps were removed with a  cold snare.                            Resection and retrieval were complete.                           The colonic mucosa otherwise was unremarkable.                            Multiple random colon biopsies were obtained to                            rule out microscopic colitis throughout the colon.                           Few medium-mouthed diverticula were found in the                            sigmoid colon, descending colon and ascending colon.                           Non-bleeding internal hemorrhoids were found during                            retroflexion. The hemorrhoids were small and Grade                            I (internal hemorrhoids that do not prolapse).                           The exam was otherwise without abnormality on                            direct and retroflexion views. The colon was highly                             redundant. Complications:            No immediate complications. Estimated Blood Loss:     Estimated blood loss: none. Impression:               - Five 4 to 8 mm polyps in the proximal sigmoid                            colon, in the proximal transverse colon and in the                            mid transverse colon, removed with a cold snare.                            Resected and retrieved.                           - Mild pancolonic diverticulosis.                           -  Non-bleeding internal hemorrhoids.                           - The examination was otherwise normal on direct                            and retroflexion views. Recommendation:           - Patient has a contact number available for                            emergencies. The signs and symptoms of potential                            delayed complications were discussed with the                            patient. Return to normal activities tomorrow.                            Written discharge instructions were provided to the                            patient.                           - Resume previous diet.                           - Continue present medications.                           - Await pathology results.                           - Repeat colonoscopy for surveillance based on                            pathology results.                           - No aspirin, ibuprofen, naproxen, or other                            non-steroidal anti-inflammatory drugs after polyp                            removal.                           - The findings and recommendations were discussed                            with the patient. Jackquline Denmark, MD 05/21/2021 3:50:41 PM This report has been signed electronically.

## 2021-05-23 ENCOUNTER — Telehealth: Payer: Self-pay | Admitting: *Deleted

## 2021-05-23 NOTE — Telephone Encounter (Signed)
No answer for post procedure followup call. Left Vm.

## 2021-05-23 NOTE — Telephone Encounter (Signed)
No answer on first follow up call. Left message  °

## 2021-06-03 ENCOUNTER — Encounter: Payer: Self-pay | Admitting: Gastroenterology

## 2022-01-31 ENCOUNTER — Other Ambulatory Visit: Payer: Self-pay | Admitting: Specialist

## 2022-01-31 DIAGNOSIS — Z1231 Encounter for screening mammogram for malignant neoplasm of breast: Secondary | ICD-10-CM

## 2022-03-18 ENCOUNTER — Other Ambulatory Visit: Payer: Self-pay | Admitting: Gastroenterology

## 2022-03-24 ENCOUNTER — Telehealth: Payer: Self-pay | Admitting: Gastroenterology

## 2022-03-24 MED ORDER — PANTOPRAZOLE SODIUM 40 MG PO TBEC: 40.0000 mg | DELAYED_RELEASE_TABLET | Freq: Every day | ORAL | 1 refills | Status: DC

## 2022-03-24 NOTE — Telephone Encounter (Signed)
Done

## 2022-03-24 NOTE — Telephone Encounter (Signed)
Patient is calling requesting a refill on Protonix Rx. Please advise

## 2022-03-27 ENCOUNTER — Ambulatory Visit
Admission: RE | Admit: 2022-03-27 | Discharge: 2022-03-27 | Disposition: A | Discharge status: Home / Self Care | Payer: PPO | Source: Ambulatory Visit | Attending: Specialist | Admitting: Specialist

## 2022-03-27 DIAGNOSIS — Z1231 Encounter for screening mammogram for malignant neoplasm of breast: Secondary | ICD-10-CM

## 2022-04-22 ENCOUNTER — Other Ambulatory Visit: Payer: Self-pay | Admitting: Specialist

## 2022-04-22 DIAGNOSIS — Z1231 Encounter for screening mammogram for malignant neoplasm of breast: Secondary | ICD-10-CM

## 2022-05-28 NOTE — Progress Notes (Signed)
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 It has now recurred and I think we need to increase her B-12 injections temporarily to raise her level. Since I don't think she is able to absorb B-12 through her GI tract. I recommend that she have injections twice monthly for 6 months and can go back to monthly but would periodically recheck labs I.e. every 6 months. The fact that her B-12 level is only 339 while on monthly injections confirms my suspicion that her body isn't absorbing B-12 and that she will need to stay on injections lifelong, consistent with pernicious anemia.  Iron Deficiency We will have her get on oral supplement but she will let me know if she does not tolerate this. I don't think a GI evaluation is neccessary at this time since she just had a colonoscopy and a EGD one year ago. However, she has a family history of colon cancer and previous tubular adenoma, so I agree with Dr. Steve Rattler recommendation of repeat every 3 years. In her case I would continue beyond age 2. She is on Celebrex but denies any stomach upset and Dr. Lyndel Safe has her on Protonix. I advised her to take iron supplement once daily. She will see Blanch Media in July and will have her labs checked again then. I would like her to include repeat iron studies at that time. We can then observe her off iron supplement, but if her next readings are low again 6 months later, I would consider referral to Dr. Lyndel Safe and/or stop the Celebrex.   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   Plan: She will receive B-12 injections twice  monthly for 6 months and then back to monthly lifelong as this is consistent with pernicious anemia. I will also recommend iron supplements once a day for 6 months. Patient had a colonoscopy and EGD 05/2021 by Dr. Lyndel Safe and he found a tubular adenoma of the colon and gastric polyps which were benign. She will see Blanch Media in July and will have her labs checked then. I would like her to include repeat iron studies at that time. We can then observe her off iron supplements, but if her next readings are low again 6 months later, I would consider referral to Dr. Lyndel Safe and/or stop the Celebrex. I think all is more consistent with deficiencies but I will request a blood smear to look for any atypical cells and recommend that I do so once a year. I will see her back in a year with CBC and CMP. The patient was provided an opportunity to ask questions and all were answered.  The patient agreed with the plan and demonstrated an understanding of the instructions. The patient was advised to call back if symptoms worsen.  I provided 20 minutes of face-to-face time during this this encounter and > 50% was spent counseling as documented under my assessment and plan.    Derwood Kaplan, MD [facility] [address] 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 She was diagnosed in August of 1999, and treated with 1 cycle of cladribine. She has never had any recurrence since that time. She was seen in March, 2021 for pancytopenia with a WBC of 3.6, ANC 1.9, hemoglobin 12.3 which dropped to 11.8, and platelet count of 122,000. Evaluation revealed a low normal B-12 level and she was placed on B-12 injections monthly. She has done well and  her white count was up to 5.1 last year with a hemoglobin of 12.6 and platelet count of 159,000. She does have an history of colon polyps and had her last colonoscopy and EGD by Dr. Lyndel Safe in February, 2023. Findings at that time include a tubular adenoma of the colon and gastric polyps which were benign. Random biopsies were normal. She does have a family history of colon cancer and so has this repeated every 3 years. She had her annual mammogram in January, 2024 and it revealed to be benign. 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 for that.   INTERVAL HISTORY:  She was last seen in 2021 when she had low blood counts and B-12 levels. She has been on monthly shots of B-12.  She is here today because she has developed pancytopenia again with WBC at 4.0, ANC of 1.9, hemoglobin at 11.8, platelets at 130, and MCV 76.3. A B-12 level was low normal at 339 and I will check an MMA level.  In addition she is iron deficient with a iron level of 54, TIBC at 461 with a 12% saturation and a ferritin of 9.  I will recommend iron supplement but I don't think she needs to go through another GI work-up unless she drops again after the iron is stopped. She is on Celebrex but denies any stomach upset and Dr. Lyndel Safe has her on Protonix. The fact that her B-12 level is only 339 while on monthly injections confirms my suspicion that her body isn't absorbing B-12 and that she will need to stay on injections lifelong, consistent with pernicious anemia. I suggest we increase her B-12 injections to twice a month for 6 months. I will recommend a iron supplement for 6 months. I advised her to take iron supplements once a day and reviewed several different types that she can choose from, to take to build her iron levels up. I informed her of possible side effects from taking iron supplements.   Patient had a colonoscopy and EGD 05/2021 by Dr. Lyndel Safe and he found a  tubular adenoma of the colon and gastric polyps which rwere. benign She will see Blanch Media in July and will have her labs checked then. I would like her to include repeat iron studies at that time. We can then observe her off iron supplements but if her next readings are low again 6 months later, I would consider referral to Dr. Lyndel Safe and/or stop the Celebrex. Other possibilities for her pancytopenia include the development of myelodysplasia from her chemotherapy, which usually occurs 10-20 years later. This would be highly unusual after one course of chemotherapy, which she received for her hairy cell leukemia. It would be even more rare for her hairy cell leukemia to re-occur. I think all is more consistent with iron and B12 deficiencies but I will request a  blood smear to look for any atypical cells.  I will see her back in a year with CBC and CMP.  She denies signs of infection such as sore throat, sinus drainage, cough, or urinary symptoms.  She denies fevers or recurrent chills. She denies pain. She denies nausea, vomiting, chest pain, dyspnea or cough. Her weight has increased 4 pounds over last year .  REVIEW OF SYSTEMS:  Review of Systems  Constitutional:  Positive for fatigue (mild). Negative for appetite change, chills, diaphoresis, fever and unexpected weight change.  HENT:   Negative for hearing loss, lump/mass, mouth sores, nosebleeds, sore throat, tinnitus, trouble swallowing and voice change.   Eyes: Negative.  Negative for eye problems and icterus.  Respiratory: Negative.  Negative for chest tightness, cough, hemoptysis, shortness of breath and wheezing.   Cardiovascular: Negative.  Negative for chest pain, leg swelling and palpitations.  Gastrointestinal: Negative.  Negative for abdominal distention, abdominal pain, blood in stool, constipation, diarrhea, nausea, rectal pain and vomiting.  Endocrine: Negative.   Genitourinary: Negative.  Negative for bladder incontinence, difficulty  urinating, dyspareunia, dysuria, frequency, hematuria, menstrual problem, nocturia, pelvic pain, vaginal bleeding and vaginal discharge.   Musculoskeletal:  Negative for arthralgias, back pain, flank pain, gait problem, myalgias, neck pain and neck stiffness.  Skin: Negative.  Negative for itching, rash and wound.  Neurological:  Negative for dizziness, extremity weakness, gait problem, headaches, light-headedness, numbness, seizures and speech difficulty.  Hematological: Negative.  Negative for adenopathy. Does not bruise/bleed easily.  Psychiatric/Behavioral: Negative.  Negative for confusion, decreased concentration, depression, sleep disturbance and suicidal ideas. The patient is not nervous/anxious.    VITALS:  BSA: 2.06 m Wt: 209 lb (94.8 kg) Ht: 5' 3.5" (1.613 m) BP: 146/87 BMI: 36.44 kg/m PHYSICAL EXAM:  Physical Exam Vitals and nursing note reviewed.  Constitutional:      General: She is not in acute distress.    Appearance: Normal appearance. She is normal weight. She is not ill-appearing, toxic-appearing or diaphoretic.  HENT:     Head: Normocephalic and atraumatic.     Right Ear: Tympanic membrane, ear canal and external ear normal. There is no impacted cerumen.     Left Ear: Tympanic membrane, ear canal and external ear normal. There is no impacted cerumen.     Nose: Nose normal. No congestion or rhinorrhea.     Mouth/Throat:     Mouth: Mucous membranes are moist.     Pharynx: Oropharynx is clear. No oropharyngeal exudate or posterior oropharyngeal erythema.  Eyes:     General: No scleral icterus.       Right eye: No discharge.        Left eye: No discharge.     Extraocular Movements: Extraocular movements intact.     Conjunctiva/sclera: Conjunctivae normal.     Pupils: Pupils are equal, round, and reactive to light.  Neck:     Vascular: No carotid bruit.  Cardiovascular:     Rate and Rhythm: Normal rate and regular rhythm.     Pulses: Normal pulses.     Heart  sounds: Normal heart sounds. No murmur heard.    No friction rub. No gallop.  Pulmonary:     Effort: Pulmonary effort is normal. No respiratory distress.     Breath sounds: Normal breath sounds. No stridor. No wheezing, rhonchi or rales.  Chest:     Chest wall: No tenderness.  Abdominal:     General: Bowel sounds are normal. There is no distension.  Palpations: Abdomen is soft. There is no hepatomegaly, splenomegaly or mass.     Tenderness: There is no abdominal tenderness. There is no right CVA tenderness, left CVA tenderness, guarding or rebound.     Hernia: No hernia is present.  Musculoskeletal:        General: No swelling, tenderness, deformity or signs of injury. Normal range of motion.     Cervical back: Normal range of motion and neck supple. No rigidity or tenderness.     Right lower leg: No edema.     Left lower leg: No edema.  Lymphadenopathy:     Cervical: No cervical adenopathy.     Right cervical: No superficial, deep or posterior cervical adenopathy.    Left cervical: No superficial, deep or posterior cervical adenopathy.     Upper Body:     Right upper body: No supraclavicular, axillary or pectoral adenopathy.     Left upper body: No supraclavicular, axillary or pectoral adenopathy.  Skin:    General: Skin is warm and dry.     Coloration: Skin is not jaundiced or pale.     Findings: No bruising, erythema, lesion or rash.  Neurological:     General: No focal deficit present.     Mental Status: She is alert and oriented to person, place, and time. Mental status is at baseline.     Cranial Nerves: No cranial nerve deficit.     Sensory: No sensory deficit.     Motor: No weakness.     Coordination: Coordination normal.     Gait: Gait normal.     Deep Tendon Reflexes: Reflexes normal.  Psychiatric:        Mood and Affect: Mood normal.        Behavior: Behavior normal.        Thought Content: Thought content normal.        Judgment: Judgment normal.    HISTORY:   Medical History Past Medical History:  Diagnosis Date   Anxiety    Cancer ([place])    leukemia   Essential hypertension    Family history of colon cancer    GERD (gastroesophageal reflux disease)    with chronic cough   History of colon polyps    History of GI bleed    History of hemorrhoids    Mixed hyperlipidemia    Nuclear sclerosis of both eyes    Obesity    Primary osteoarthritis involving multiple joints    Vitamin D deficiency     Surgical History Past Surgical History:  Procedure Laterality Date   APPENDECTOMY     CHOLECYSTECTOMY  2006   laparoscopic due to stones   COLONOSCOPY  08/27/2009   Mild pancolonic diverticulosis. Small internal hemorrhoids.   COLONOSCOPY     ESOPHAGOGASTRODUODENOSCOPY  08/27/2009   Antral polyp status post polypectomy. Mild gastritis   UPPER GASTROINTESTINAL ENDOSCOPY     VAGINAL HYSTERECTOMY      Family History Family History  Problem Relation Age of Onset   COPD Mother    Diabetes Mother    Heart disease Mother    COPD Father    Colon cancer Sister    Rectal cancer Neg Hx    Stomach cancer Neg Hx    Esophageal cancer Neg Hx    Social History Social History   Substance and Sexual Activity  Alcohol Use Not Currently    Social History   Substance and Sexual Activity  Drug Use Never   Social History   Tobacco  Use  Smoking Status Former   Types: Cigarettes  Smokeless Tobacco Never   LABS:    CMP 5 mo ago 09/05/22023  Sodium 135 - 146 MMOL/L 139  Potassium 3.5 - 5.3 MMOL/L 4.6  Chloride 98 - 110 MMOL/L 103  CO2 23 - 30 MMOL/L 27  BUN 8 - 24 MG/DL 12  Glucose 70 - 99 MG/DL 164 High   Calcium 8.5 - 10.5 MG/DL 9.4  Total Protein 6.0 - 8.3 G/DL 6.5  Albumin 3.5 - 5.0 G/DL 4.6  Total Bilirubin 0.1 - 1.2 MG/DL 0.4  Alkaline Phosphatase 25 - 125 IU/L or U/L 56  AST (SGOT) 5 - 40 IU/L or U/L 20  ALT (SGPT) 5 - 50 IU/L or U/L 21  Anion Gap 4 - 14 MMOL/L 9    Ref Range & Units 5 mo ago    LDL Direct <130 mg/dL 97   Total Cholesterol 25 - 199 MG/DL 162  Triglycerides 10 - 150 MG/DL 156 High   HDL Cholesterol 35 - 135 MG/DL 50  Total Chol / HDL Cholesterol <4.5 3.2  Non-HDL Cholesterol MG/DL 112    Ref Range & Units 5 mo ago 09/05/22023  CK 50 - 160 IU/L or U/L 92   STUDIES:    EXAM: 03/27/2022 DIGITAL SCREENING BILATERAL MAMMOGRAM WITH TOMOSYNTHESIS AND CAD IMPRESSION: No mammographic evidence of malignancy. A result letter of this screening mammogram will be mailed directly to the patient.   I,Jasmine M Lassiter,acting as a scribe for Derwood Kaplan, MD.,have documented all relevant documentation on the behalf of Derwood Kaplan, MD,as directed by  Derwood Kaplan, MD while in the presence of Derwood Kaplan, MD.

## 2022-05-30 ENCOUNTER — Inpatient Hospital Stay: Payer: PPO

## 2022-05-30 ENCOUNTER — Encounter: Payer: Self-pay | Admitting: Oncology

## 2022-05-30 ENCOUNTER — Other Ambulatory Visit: Payer: Self-pay | Admitting: Oncology

## 2022-05-30 ENCOUNTER — Inpatient Hospital Stay: Payer: PPO | Attending: Oncology | Admitting: Oncology

## 2022-05-30 VITALS — BP 146/87 | HR 80 | Temp 97.9°F | Resp 18 | Ht 63.5 in | Wt 209.0 lb

## 2022-05-30 DIAGNOSIS — D509 Iron deficiency anemia, unspecified: Secondary | ICD-10-CM

## 2022-05-30 DIAGNOSIS — L439 Lichen planus, unspecified: Secondary | ICD-10-CM | POA: Insufficient documentation

## 2022-05-30 DIAGNOSIS — E611 Iron deficiency: Secondary | ICD-10-CM | POA: Insufficient documentation

## 2022-05-30 DIAGNOSIS — C9140 Hairy cell leukemia not having achieved remission: Secondary | ICD-10-CM

## 2022-05-30 DIAGNOSIS — C9141 Hairy cell leukemia, in remission: Secondary | ICD-10-CM | POA: Insufficient documentation

## 2022-05-30 DIAGNOSIS — Z800 Family history of malignant neoplasm of digestive organs: Secondary | ICD-10-CM | POA: Diagnosis not present

## 2022-05-30 DIAGNOSIS — Z87891 Personal history of nicotine dependence: Secondary | ICD-10-CM | POA: Diagnosis not present

## 2022-05-30 DIAGNOSIS — D510 Vitamin B12 deficiency anemia due to intrinsic factor deficiency: Secondary | ICD-10-CM | POA: Insufficient documentation

## 2022-05-30 LAB — CBC WITH DIFFERENTIAL (CANCER CENTER ONLY)
Abs Immature Granulocytes: 0.03 10*3/uL (ref 0.00–0.07)
Basophils Absolute: 0 10*3/uL (ref 0.0–0.1)
Basophils Relative: 1 %
Eosinophils Absolute: 0 10*3/uL (ref 0.0–0.5)
Eosinophils Relative: 1 %
HCT: 40 % (ref 36.0–46.0)
Hemoglobin: 12.5 g/dL (ref 12.0–15.0)
Immature Granulocytes: 1 %
Lymphocytes Relative: 50 %
Lymphs Abs: 3.3 10*3/uL (ref 0.7–4.0)
MCH: 25.2 pg — ABNORMAL LOW (ref 26.0–34.0)
MCHC: 31.3 g/dL (ref 30.0–36.0)
MCV: 80.5 fL (ref 80.0–100.0)
Monocytes Absolute: 0.3 10*3/uL (ref 0.1–1.0)
Monocytes Relative: 5 %
Neutro Abs: 2.7 10*3/uL (ref 1.7–7.7)
Neutrophils Relative %: 42 %
Platelet Count: 152 10*3/uL (ref 150–400)
RBC: 4.97 MIL/uL (ref 3.87–5.11)
RDW: 14.8 % (ref 11.5–15.5)
WBC Count: 6.3 10*3/uL (ref 4.0–10.5)
nRBC: 0 % (ref 0.0–0.2)

## 2022-05-30 LAB — CMP (CANCER CENTER ONLY)
ALT: 29 U/L (ref 0–44)
AST: 31 U/L (ref 15–41)
Albumin: 4.1 g/dL (ref 3.5–5.0)
Alkaline Phosphatase: 58 U/L (ref 38–126)
Anion gap: 11 (ref 5–15)
BUN: 20 mg/dL (ref 8–23)
CO2: 24 mmol/L (ref 22–32)
Calcium: 9.4 mg/dL (ref 8.9–10.3)
Chloride: 105 mmol/L (ref 98–111)
Creatinine: 1.06 mg/dL — ABNORMAL HIGH (ref 0.44–1.00)
GFR, Estimated: 56 mL/min — ABNORMAL LOW (ref >60)
Glucose, Bld: 115 mg/dL — ABNORMAL HIGH (ref 70–99)
Potassium: 4 mmol/L (ref 3.5–5.1)
Sodium: 140 mmol/L (ref 135–145)
Total Bilirubin: 0.7 mg/dL (ref 0.3–1.2)
Total Protein: 7 g/dL (ref 6.5–8.1)

## 2022-06-03 ENCOUNTER — Telehealth: Payer: Self-pay

## 2022-06-03 NOTE — Telephone Encounter (Signed)
Patient notified

## 2022-06-03 NOTE — Telephone Encounter (Signed)
-----   Message from Derwood Kaplan, MD sent at 06/02/2022  7:58 PM EST ----- Regarding: call Tell her labs are normal, incl all the blood counts

## 2022-06-03 NOTE — Telephone Encounter (Signed)
Attempted to contact patient. No answer. 

## 2022-06-13 DIAGNOSIS — D519 Vitamin B12 deficiency anemia, unspecified: Secondary | ICD-10-CM | POA: Insufficient documentation

## 2022-06-13 DIAGNOSIS — D509 Iron deficiency anemia, unspecified: Secondary | ICD-10-CM | POA: Insufficient documentation

## 2022-09-19 ENCOUNTER — Other Ambulatory Visit: Payer: Self-pay | Admitting: Gastroenterology

## 2022-10-30 ENCOUNTER — Other Ambulatory Visit: Payer: Self-pay

## 2022-10-30 ENCOUNTER — Emergency Department (HOSPITAL_COMMUNITY)
Admission: EM | Admit: 2022-10-30 | Discharge: 2022-10-31 | Disposition: A | Discharge status: Home / Self Care | Payer: PPO | Attending: Student | Admitting: Student

## 2022-10-30 DIAGNOSIS — H539 Unspecified visual disturbance: Secondary | ICD-10-CM

## 2022-10-30 DIAGNOSIS — I10 Essential (primary) hypertension: Secondary | ICD-10-CM | POA: Insufficient documentation

## 2022-10-30 DIAGNOSIS — Z79899 Other long term (current) drug therapy: Secondary | ICD-10-CM | POA: Diagnosis not present

## 2022-10-30 DIAGNOSIS — E119 Type 2 diabetes mellitus without complications: Secondary | ICD-10-CM | POA: Diagnosis not present

## 2022-10-30 DIAGNOSIS — H538 Other visual disturbances: Secondary | ICD-10-CM | POA: Insufficient documentation

## 2022-10-30 DIAGNOSIS — Z7984 Long term (current) use of oral hypoglycemic drugs: Secondary | ICD-10-CM | POA: Diagnosis not present

## 2022-10-30 LAB — CBC WITH DIFFERENTIAL/PLATELET
Abs Immature Granulocytes: 0.04 10*3/uL (ref 0.00–0.07)
Basophils Absolute: 0 10*3/uL (ref 0.0–0.1)
Basophils Relative: 1 %
Eosinophils Absolute: 0.1 10*3/uL (ref 0.0–0.5)
Eosinophils Relative: 1 %
HCT: 41.2 % (ref 36.0–46.0)
Hemoglobin: 13.9 g/dL (ref 12.0–15.0)
Immature Granulocytes: 1 %
Lymphocytes Relative: 45 %
Lymphs Abs: 2.7 10*3/uL (ref 0.7–4.0)
MCH: 28.6 pg (ref 26.0–34.0)
MCHC: 33.7 g/dL (ref 30.0–36.0)
MCV: 84.8 fL (ref 80.0–100.0)
Monocytes Absolute: 0.3 10*3/uL (ref 0.1–1.0)
Monocytes Relative: 5 %
Neutro Abs: 2.8 10*3/uL (ref 1.7–7.7)
Neutrophils Relative %: 47 %
Platelets: 183 10*3/uL (ref 150–400)
RBC: 4.86 MIL/uL (ref 3.87–5.11)
RDW: 13.6 % (ref 11.5–15.5)
WBC: 6 10*3/uL (ref 4.0–10.5)
nRBC: 0 % (ref 0.0–0.2)

## 2022-10-30 MED ORDER — LOSARTAN POTASSIUM 50 MG PO TABS: 100.0000 mg | ORAL_TABLET | Freq: Every day | ORAL | Status: DC
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
## 2022-10-30 NOTE — ED Provider Notes (Signed)
[place] EMERGENCY DEPARTMENT AT [facility] Provider Note   CSN: [id number] Arrival date & time: 10/30/22  1757     History {Add pertinent medical, surgical, social history, OB history to HPI:1} Chief Complaint  Patient presents with   Eye Problem    Kashira Behunin Fite is a 72 y.o. female.  Lyana Asbill Mabe Hegler is a 72 y.o. female with a history significant for HTN, DM and anemia presents to the ED today with chief complaint of visual disturbance. She was advised to come to the ED by her eye doctor. The eye problems started yesterday while the patient was driving a car. She started to see webbing over both her eyes described as "seeing all of my arteries and veins in my eye". She experienced no pain and no flashes of light and no headache. She pulled her car over and was assessed at a nearby fire department for a stroke. Her blood pressure at that time was over 200 systolic. She went home after TIA evaluation at the fire station was negative. Today she visited her PCP for recheck of her symptoms who increased her dose of Losartan to 100mg . Subsequently patient then spoke to her eye doctor about the incident and was advised to report to the ED straight away to get assessed. The patient has a history of floaters at baseline; feels these have been stable. She denies eye or head trauma, complete vision loss, photophobia. No history of glaucoma.    The history is provided by the patient and the spouse. No language interpreter was used.  Eye Problem      Home Medications Prior to Admission medications   Medication Sig Start Date End Date Taking? Authorizing Provider  ALPRAZolam Prudy Feeler) 0.5 MG tablet Take 0.5 mg by mouth daily.    [provider]  atorvastatin (LIPITOR) 10 MG tablet Take 10 mg by mouth daily.    [provider]  busPIRone (BUSPAR) 5 MG tablet Take 5 mg by mouth 2 (two) times daily. 12/28/20   [provider]  celecoxib (CELEBREX)  200 MG capsule Take by mouth daily. 11/25/19   [provider]  clobetasol ointment (TEMOVATE) 0.05 % Apply bid to affected area 04/25/22   [provider]  Coenzyme Q10 (CO Q 10 PO) Take by mouth.    [provider]  cyanocobalamin (,VITAMIN B-12,) 1000 MCG/ML injection Inject into the muscle. 08/08/20   [provider]  dexamethasone (DECADRON) 0.5 MG/5ML solution Take by mouth. 02/16/21   [provider]  ergocalciferol (VITAMIN D2) 1.25 MG (50000 UT) capsule TAKE 1 CAPSULE BY MOUTH TWICE A WEEK 11/25/19   [provider]  fluconazole (DIFLUCAN) 100 MG tablet Take 100 mg by mouth daily. 04/25/22   [provider]  losartan (COZAAR) 50 MG tablet Take 1 tablet by mouth daily. 10/05/19   [provider]  metFORMIN (GLUCOPHAGE-XR) 500 MG 24 hr tablet Take 500 mg by mouth 2 (two) times daily. 03/01/21   [provider]  Omega-3 Fatty Acids (FISH OIL PO) Take by mouth 2 (two) times daily.    [provider]  pantoprazole (PROTONIX) 40 MG tablet TAKE 1 TABLET BY MOUTH DAILY. 09/19/22   Lynann Bologna, MD  sitaGLIPtin (JANUVIA) 100 MG tablet Take 1 tablet by mouth daily. 11/25/19   [provider]      Allergies    Senna and Senna-lax    Review of Systems   Review of Systems Ten systems reviewed and  are negative for acute change, except as noted in the HPI.    Physical Exam Updated Vital Signs BP (!) 146/63 (BP Location: Left Arm)   Pulse 95   Temp 97.9 F (36.6 C) (Oral)   Resp (!) 22   Ht 5\' 3"  (1.6 m)   Wt 94 kg   SpO2 100%   BMI 36.71 kg/m   Physical Exam Vitals and nursing note reviewed.  Constitutional:      General: She is not in acute distress.    Appearance: She is well-developed. She is not diaphoretic.     Comments: Nontoxic appearing, pleasant female.  HENT:     Head: Normocephalic and atraumatic.     Comments: Reported "webbing" defect in the right upper quadrant of patient's  vision. EOMs intact. No pain with eye movement. No proptosis or hyphema. IOP 23 OD w/95% CI.  Eyes:     General: No scleral icterus.    Extraocular Movements: EOM normal.     Conjunctiva/sclera: Conjunctivae normal.  Pulmonary:     Effort: Pulmonary effort is normal. No respiratory distress.     Comments: Respirations even and unlabored Musculoskeletal:        General: Normal range of motion.     Cervical back: Normal range of motion.  Skin:    General: Skin is warm and dry.     Coloration: Skin is not pale.     Findings: No erythema or rash.  Neurological:     Mental Status: She is alert and oriented to person, place, and time.     Comments: GCS 15. Speech is goal oriented. No deficits appreciated to CN III-XII; symmetric eyebrow raise, no facial drooping, tongue midline. Patient has equal grip strength bilaterally with 5/5 strength against resistance in all major muscle groups bilaterally. Sensation to light touch intact. Patient moves extremities without ataxia.   Psychiatric:        Mood and Affect: Mood and affect normal.        Behavior: Behavior normal.     ED Results / Procedures / Treatments   Labs (all labs ordered are listed, but only abnormal results are displayed) Labs Reviewed  COMPREHENSIVE METABOLIC PANEL - Abnormal; Notable for the following components:      Result Value   Glucose, Bld 165 (*)    Total Protein 6.2 (*)    All other components within normal limits  C-REACTIVE PROTEIN - Abnormal; Notable for the following components:   CRP 1.3 (*)    All other components within normal limits  CBC WITH DIFFERENTIAL/PLATELET  SEDIMENTATION RATE    EKG None  Radiology CT ANGIO HEAD NECK W WO CM  Result Date: 10/31/2022 CLINICAL DATA:  72 year old female with acute onset vision changes while driving. Hypertensive at that time (200/109). Left vision has improved, right side vision remains abnormal. EXAM: CT ANGIOGRAPHY HEAD AND NECK WITH AND WITHOUT CONTRAST  TECHNIQUE: Multidetector CT imaging of the head and neck was performed using the standard protocol during bolus administration of intravenous contrast. Multiplanar CT image reconstructions and MIPs were obtained to evaluate the vascular anatomy. Carotid stenosis measurements (when applicable) are obtained utilizing NASCET criteria, using the distal internal carotid diameter as the denominator. RADIATION DOSE REDUCTION: This exam was performed according to the departmental dose-optimization program which includes automated exposure control, adjustment of the mA and/or kV according to patient size and/or use of iterative reconstruction technique. CONTRAST:  75mL OMNIPAQUE IOHEXOL 350 MG/ML SOLN COMPARISON:  Brain MRI today. [facility]  noncontrast head CT 10/14/2005. FINDINGS: CT HEAD Brain: No midline shift, ventriculomegaly, mass effect, evidence of mass lesion, intracranial hemorrhage or evidence of cortically based acute infarction. Gray-white matter differentiation is within normal limits throughout the brain. Mild vascular calcifications in the bilateral basal ganglia. Calvarium and skull base: Negative. Paranasal sinuses: Visualized paranasal sinuses and mastoids are stable and well aerated. Orbits: Visualized orbit soft tissues are within normal limits. Visualized scalp soft tissues are within normal limits. CTA NECK Skeleton: Cervical spine degeneration with multilevel degenerative appearing ankylosis. Associated chronic facet arthropathy. No acute osseous abnormality identified. Upper chest: Mild mediastinal lipomatosis, otherwise negative. Other neck: Negative. Aortic arch: Calcified aortic atherosclerosis. 3 vessel arch configuration. Right carotid system: Mostly calcified brachiocephalic artery plaque without stenosis. Negative right CCA origin. Mild to moderate soft and calcified plaque at the right carotid bifurcation, right ICA origin and bulb. No stenosis to the skull base. Left carotid system:  Mild mostly calcified plaque at the left CCA origin without stenosis. Ventral left CCA soft plaque with mild ulceration series 12, image 119. No stenosis before the bifurcation. Mild mostly calcified plaque at the left ICA origin and bulb without stenosis. Vertebral arteries: Right subclavian artery origin calcified plaque appears to result in high-grade stenosis numerically estimated at 70 % with respect to the distal vessel. Right vertebral artery origin is normal. Right vertebral appears fairly codominant and is patent to the skull base with no significant plaque or stenosis. Proximal left subclavian artery soft and calcified plaque with less than 50% stenosis with respect to the distal vessel. Normal left vertebral artery origin. Left vertebral artery is mildly dominant and patent to the skull base with no significant plaque or stenosis. CTA HEAD Posterior circulation: Patent distal vertebral arteries and vertebrobasilar junction with no plaque or stenosis. Dominant left V4 segment. Normal PICA origins. Patent basilar artery without stenosis. Patent SCA and PCA origins. Posterior communicating arteries are diminutive or absent. Bilateral PCA branches are within normal limits. Anterior circulation: Both ICA siphons are patent. Left siphon moderate calcified plaque, but only mild left siphon stenosis. Left ophthalmic artery origin is patent and normal on series 14, image 103. Contralateral right siphon more extensive moderate to severe cavernous and supraclinoid segment calcified plaque. Up to moderate stenosis at the anterior genu (series 14, image 90). Distal right ICA remains patent. Right ophthalmic artery origin is difficult to identify, but the right ophthalmic artery is patent on series 13, image 146 in the posterior orbit. And there is fairly symmetric ophthalmic artery enhancement bilaterally. Patent carotid termini. MCA and ACA origins appear normal. Normal anterior communicating artery. Bilateral ACA  branches are within normal limits. Left MCA M1 segment and bifurcation are patent without stenosis. Right MCA M1 segment and bifurcation are patent without stenosis. Bilateral MCA branches are within normal limits. Venous sinuses: Early contrast timing, grossly patent. Anatomic variants: Dominant left vertebral artery. Review of the MIP images confirms the above findings IMPRESSION: 1. Negative for large vessel occlusion and patent proximal ophthalmic arteries. But atherosclerosis in the head and neck with notable stenosis: - Up to Moderate Right ICA siphon stenosis at the anterior genu. - Right Subclavian Artery origin stenosis estimated at 70%. 2. Negative for age CT appearance of the brain. 3.  Aortic Atherosclerosis (ICD10-I70.0). Electronically Signed   By: Odessa Fleming M.D.   On: 10/31/2022 06:01   MR BRAIN WO CONTRAST  Result Date: 10/31/2022 CLINICAL DATA:  72 year old female with acute onset vision changes while driving. Hypertensive at that time (  200/109). Left vision has improved, right side vision remains abnormal. EXAM: MRI HEAD WITHOUT CONTRAST TECHNIQUE: Multiplanar, multiecho pulse sequences of the brain and surrounding structures were obtained without intravenous contrast. COMPARISON:  [facility] noncontrast head CT 10/14/2005. FINDINGS: Brain: No restricted diffusion to suggest acute infarction. No midline shift, mass effect, evidence of mass lesion, ventriculomegaly, extra-axial collection or acute intracranial hemorrhage. Cervicomedullary junction and pituitary are within normal limits. Cerebral volume is within normal limits for age. Wallace Cullens and white matter signal is within normal limits for age throughout the brain. No cerebral edema, cortical encephalomalacia, or chronic cerebral blood products identified. Vascular: Major intracranial vascular flow voids are preserved. Skull and upper cervical spine: Negative for age visible cervical spine. Visualized bone marrow signal is within normal  limits. Sinuses/Orbits: Normal suprasellar cistern. Unremarkable optic chiasm and noncontrast cavernous sinus. Orbits soft tissues appears symmetric and negative. Paranasal sinuses and mastoids are well aerated. Other: Visible internal auditory structures appear normal. Negative visible scalp and face. IMPRESSION: No acute intracranial abnormality and normal for age noncontrast MRI appearance of the Brain. No explanation for vision changes. Electronically Signed   By: Odessa Fleming M.D.   On: 10/31/2022 05:42    Procedures Ultrasound ED Ocular  Date/Time: 10/30/2022 11:22 PM  Performed by: Antony Madura, PA-C Authorized by: Antony Madura, PA-C   PROCEDURE DETAILS:    Indications: visual change     Assessed:  Right eye   Right eye axial view: obtained     Images: archived     Limitations:  Positioning (emergent procedure) RIGHT EYE FINDINGS:     no foreign body noted in right eye    right eye lens not dislodged    no evidence of retinal detachment of the right eye Comments:     No abnormalities noted on bedside US   {Document cardiac monitor, telemetry assessment procedure when appropriate:1}  Medications Ordered in ED Medications  losartan (COZAAR) tablet 100 mg (100 mg Oral Given 10/31/22 0257)  LORazepam (ATIVAN) tablet 1 mg (1 mg Oral Given 10/31/22 0257)  iohexol (OMNIPAQUE) 350 MG/ML injection 75 mL (75 mLs Intravenous Contrast Given 10/31/22 0541)    ED Course/ Medical Decision Making/ A&P Clinical Course as of 10/31/22 0710  Fri Oct 31, 2022  0019 Spoke with Dr. Iver Nestle regarding patient's symptoms who advises a CTA head/neck as well as MRI brain. Dr. Iver Nestle also requests addition of inflammatory markers. [KH]    Clinical Course User Index [KH] Antony Madura, PA-C   {   Click here for ABCD2, HEART and other calculatorsREFRESH Note before signing :1}                          Medical Decision Making Amount and/or Complexity of Data Reviewed Labs: ordered. Radiology:  ordered.  Risk Prescription drug management.   ***  {Document critical care time when appropriate:1} {Document review of labs and clinical decision tools ie heart score, Chads2Vasc2 etc:1}  {Document your independent review of radiology images, and any outside records:1} {Document your discussion with family members, caretakers, and with consultants:1} {Document social determinants of health affecting pt's care:1} {Document your decision making why or why not admission, treatments were needed:1} Final Clinical Impression(s) / ED Diagnoses Final diagnoses:  Visual disturbance    Rx / DC Orders ED Discharge Orders     None

## 2022-10-31 ENCOUNTER — Emergency Department (HOSPITAL_COMMUNITY): Payer: PPO

## 2022-10-31 LAB — SEDIMENTATION RATE: Sed Rate: 3 mm/hr (ref 0–22)

## 2022-10-31 LAB — C-REACTIVE PROTEIN: CRP: 1.3 mg/dL — ABNORMAL HIGH (ref <1.0)

## 2022-10-31 MED ORDER — LORAZEPAM 1 MG PO TABS
1.0000 mg | ORAL_TABLET | Freq: Once | ORAL | Status: AC
  Administered 2022-10-31: 1 mg via ORAL
  Filled 2022-10-31: qty 1

## 2022-10-31 MED ORDER — LOSARTAN POTASSIUM 50 MG PO TABS
100.0000 mg | ORAL_TABLET | Freq: Every day | ORAL | Status: DC
  Administered 2022-10-31: 100 mg via ORAL
  Filled 2022-10-31: qty 2

## 2022-10-31 MED ORDER — IOHEXOL 350 MG/ML SOLN
75.0000 mL | Freq: Once | INTRAVENOUS | Status: AC | PRN
  Administered 2022-10-31: 75 mL via INTRAVENOUS

## 2022-10-31 NOTE — Discharge Instructions (Signed)
Your evaluation in the ED has been reassuring.  You do have a degree of arterial stenosis in some of the vessels of your neck.  This can be followed up on by your primary care doctor at your next visit.  Given your persistent visual disturbances, we do recommend follow-up with an ophthalmologist.  You have been given referral to Dr. Luci Bank.  Call later this morning to make an appointment. Return to the ED for new or concerning symptoms.

## 2022-10-31 NOTE — ED Notes (Signed)
Patient transported to MRI 

## 2022-12-16 ENCOUNTER — Encounter: Payer: Self-pay | Admitting: Vascular Surgery

## 2022-12-16 ENCOUNTER — Ambulatory Visit: Payer: PPO | Admitting: Vascular Surgery

## 2022-12-16 VITALS — BP 146/77 | HR 82 | Temp 98.2°F | Resp 18 | Ht 63.5 in | Wt 205.7 lb

## 2022-12-16 DIAGNOSIS — I6529 Occlusion and stenosis of unspecified carotid artery: Secondary | ICD-10-CM | POA: Insufficient documentation

## 2022-12-16 DIAGNOSIS — I6523 Occlusion and stenosis of bilateral carotid arteries: Secondary | ICD-10-CM

## 2022-12-16 NOTE — Progress Notes (Signed)
Patient name: Judith Matthews MRN: [id number] DOB: December 23, 1950 Sex: female  REASON FOR CONSULT: Visual changes with carotid stenosis  HPI: Judith Matthews is a 72 y.o. female, with history of leukemia, hypertension, hyperlipidemia, DM that presents for evaluation of visual changes with carotid stenosis.  She had a "spider webbing" feeling that happened acutely on 10/31/2022.  She was instructed to go to the ED.  Patient was seen in the ED.  She had an MRI brain on 10/31/2022 with no acute changes.  CTA neck on 10/31/2022 showed moderate right ICA siphon stenosis but no significant stenosis in the extracranial carotid arteries.  Further evaluation by ophthalmology found she had 3 retinal tears in the right eye.  These have since been repaired with surgery with improvement in her clinical symptoms.  She had no other focal stroke symptoms including weakness in arm or leg slurred speech etc.  She is on aspirin statin.  No history of neck surgery or neck radiation.  Past Medical History:  Diagnosis Date   Anxiety    Cancer (HCC)    leukemia   Essential hypertension    Family history of colon cancer    GERD (gastroesophageal reflux disease)    with chronic cough   History of colon polyps    History of GI bleed    History of hemorrhoids    Mixed hyperlipidemia    Nuclear sclerosis of both eyes    Obesity    Primary osteoarthritis involving multiple joints    Vitamin D deficiency     Past Surgical History:  Procedure Laterality Date   APPENDECTOMY     CHOLECYSTECTOMY  2006   laparoscopic due to stones   COLONOSCOPY  08/27/2009   Mild pancolonic diverticulosis. Small internal hemorrhoids.   COLONOSCOPY     ESOPHAGOGASTRODUODENOSCOPY  08/27/2009   Antral polyp status post polypectomy. Mild gastritis   UPPER GASTROINTESTINAL ENDOSCOPY     VAGINAL HYSTERECTOMY      Family History  Problem Relation Age of Onset   COPD Mother    Diabetes Mother    Heart disease Mother     COPD Father    Colon cancer Sister    Rectal cancer Neg Hx    Stomach cancer Neg Hx    Esophageal cancer Neg Hx     SOCIAL HISTORY: Social History   Socioeconomic History   Marital status: Married    Spouse name: Not on file   Number of children: Not on file   Years of education: Not on file   Highest education level: Not on file  Occupational History   Occupation: retired  Tobacco Use   Smoking status: Former    Types: Cigarettes   Smokeless tobacco: Never  Vaping Use   Vaping status: Never Used  Substance and Sexual Activity   Alcohol use: Not Currently   Drug use: Never   Sexual activity: Not on file  Other Topics Concern   Not on file  Social History Narrative   Not on file   Social Determinants of Health   Financial Resource Strain: Not on file  Food Insecurity: Not on file  Transportation Needs: Not on file  Physical Activity: Not on file  Stress: Not on file  Social Connections: Not on file  Intimate Partner Violence: Not on file    Allergies  Allergen Reactions   Senna Other (See Comments)   Senna-Lax     REACTION: nausea, vomiting    Current Outpatient Medications  Medication Sig Dispense Refill   ALPRAZolam (XANAX) 0.5 MG tablet Take 0.5 mg by mouth daily.     atorvastatin (LIPITOR) 10 MG tablet Take 10 mg by mouth daily.     busPIRone (BUSPAR) 5 MG tablet Take 5 mg by mouth 2 (two) times daily.     celecoxib (CELEBREX) 200 MG capsule Take by mouth daily.     clobetasol ointment (TEMOVATE) 0.05 % Apply bid to affected area     Coenzyme Q10 (CO Q 10 PO) Take by mouth.     cyanocobalamin (,VITAMIN B-12,) 1000 MCG/ML injection Inject into the muscle.     dexamethasone (DECADRON) 0.5 MG/5ML solution Take by mouth.     ergocalciferol (VITAMIN D2) 1.25 MG (50000 UT) capsule TAKE 1 CAPSULE BY MOUTH TWICE A WEEK     fluconazole (DIFLUCAN) 100 MG tablet Take 100 mg by mouth daily.     losartan (COZAAR) 50 MG tablet Take 1 tablet by mouth daily.      metFORMIN (GLUCOPHAGE-XR) 500 MG 24 hr tablet Take 500 mg by mouth 2 (two) times daily.     Omega-3 Fatty Acids (FISH OIL PO) Take by mouth 2 (two) times daily.     pantoprazole (PROTONIX) 40 MG tablet TAKE 1 TABLET BY MOUTH DAILY. 90 tablet 0   sitaGLIPtin (JANUVIA) 100 MG tablet Take 1 tablet by mouth daily.     No current facility-administered medications for this visit.    REVIEW OF SYSTEMS:  [X]  denotes positive finding, [ ]  denotes negative finding Cardiac  Comments:  Chest pain or chest pressure:    Shortness of breath upon exertion:    Short of breath when lying flat:    Irregular heart rhythm:        Vascular    Pain in calf, thigh, or hip brought on by ambulation:    Pain in feet at night that wakes you up from your sleep:     Blood clot in your veins:    Leg swelling:         Pulmonary    Oxygen at home:    Productive cough:     Wheezing:         Neurologic    Sudden weakness in arms or legs:     Sudden numbness in arms or legs:     Sudden onset of difficulty speaking or slurred speech:    Temporary loss of vision in one eye:     Problems with dizziness:         Gastrointestinal    Blood in stool:     Vomited blood:         Genitourinary    Burning when urinating:     Blood in urine:        Psychiatric    Major depression:         Hematologic    Bleeding problems:    Problems with blood clotting too easily:        Skin    Rashes or ulcers:        Constitutional    Fever or chills:      PHYSICAL EXAM: There were no vitals filed for this visit.  GENERAL: The patient is a well-nourished female, in no acute distress. The vital signs are documented above. CARDIAC: There is a regular rate and rhythm.  VASCULAR:  No previous neck incisions PULMONARY: No respiratory distress. ABDOMEN: Soft and non-tender. MUSCULOSKELETAL: There are no major deformities or cyanosis. NEUROLOGIC: No focal  weakness or paresthesias are detected.  CN II-XII grossly  intact. SKIN: There are no ulcers or rashes noted. PSYCHIATRIC: The patient has a normal affect.  DATA:   CTA neck reviewed from 10/31/2022 with some moderate plaque in the right carotid bifurcation that appears less than 50% and minimal plaque in the left carotid bifurcation  Assessment/Plan:  72 y.o. female, with history of leukemia, hypertension, hyperlipidemia, DM that presents for evaluation of visual changes with carotid stenosis.  Her vision loss earlier this year on 10/31/2022 was found to be associated with three retinal tears and she has since undergone surgery with improvement in her symptoms.  I do not think this represented a TIA or stroke.  MRI was negative.  The moderate right ICA siphon stenosis is at the skull base and not surgically accessible.  This will be managed medically.  CTA neck does show some moderate plaque in the right carotid bifurcation (extracranial carotid) that looks less than 50% by my review.  The discussed I would treat her carotid disease as asymptomatic and I do not think she needs any surgical intervention at this time.  I would recommend a repeat carotid duplex in 6 months here in the office.  Discussed for asymptomatic carotid disease recommend surgery for more than 80% stenosis and she does not meet that criteria at this time.  She should remain on aspirin statin.   Cephus Shelling, MD [facility] Office: [phone]

## 2022-12-24 ENCOUNTER — Other Ambulatory Visit: Payer: Self-pay

## 2022-12-24 DIAGNOSIS — I6523 Occlusion and stenosis of bilateral carotid arteries: Secondary | ICD-10-CM

## 2022-12-25 ENCOUNTER — Other Ambulatory Visit: Payer: Self-pay | Admitting: Gastroenterology

## 2023-03-30 ENCOUNTER — Ambulatory Visit
Admission: RE | Admit: 2023-03-30 | Discharge: 2023-03-30 | Disposition: A | Discharge status: Home / Self Care | Payer: PPO | Source: Ambulatory Visit | Attending: Specialist | Admitting: Specialist

## 2023-03-30 DIAGNOSIS — Z1231 Encounter for screening mammogram for malignant neoplasm of breast: Secondary | ICD-10-CM

## 2023-05-29 ENCOUNTER — Inpatient Hospital Stay: Payer: PPO

## 2023-05-29 ENCOUNTER — Inpatient Hospital Stay: Payer: PPO | Admitting: Oncology

## 2023-06-12 ENCOUNTER — Inpatient Hospital Stay: Payer: PPO | Attending: Oncology

## 2023-06-12 ENCOUNTER — Other Ambulatory Visit: Payer: Self-pay | Admitting: Oncology

## 2023-06-12 ENCOUNTER — Inpatient Hospital Stay: Payer: PPO | Admitting: Oncology

## 2023-06-12 ENCOUNTER — Other Ambulatory Visit: Payer: Self-pay

## 2023-06-12 ENCOUNTER — Encounter: Payer: Self-pay | Admitting: Oncology

## 2023-06-12 VITALS — BP 138/80 | HR 98 | Temp 98.0°F | Resp 18 | Ht 63.5 in | Wt 197.4 lb

## 2023-06-12 DIAGNOSIS — E611 Iron deficiency: Secondary | ICD-10-CM | POA: Insufficient documentation

## 2023-06-12 DIAGNOSIS — Z87891 Personal history of nicotine dependence: Secondary | ICD-10-CM | POA: Diagnosis not present

## 2023-06-12 DIAGNOSIS — C9140 Hairy cell leukemia not having achieved remission: Secondary | ICD-10-CM

## 2023-06-12 DIAGNOSIS — C9141 Hairy cell leukemia, in remission: Secondary | ICD-10-CM | POA: Diagnosis present

## 2023-06-12 DIAGNOSIS — L439 Lichen planus, unspecified: Secondary | ICD-10-CM | POA: Insufficient documentation

## 2023-06-12 DIAGNOSIS — Z800 Family history of malignant neoplasm of digestive organs: Secondary | ICD-10-CM | POA: Diagnosis not present

## 2023-06-12 DIAGNOSIS — Z79899 Other long term (current) drug therapy: Secondary | ICD-10-CM | POA: Diagnosis not present

## 2023-06-12 LAB — CBC WITH DIFFERENTIAL (CANCER CENTER ONLY)
Abs Immature Granulocytes: 0.04 10*3/uL (ref 0.00–0.07)
Basophils Absolute: 0 10*3/uL (ref 0.0–0.1)
Basophils Relative: 0 %
Eosinophils Absolute: 0 10*3/uL (ref 0.0–0.5)
Eosinophils Relative: 0 %
HCT: 41 % (ref 36.0–46.0)
Hemoglobin: 13.7 g/dL (ref 12.0–15.0)
Immature Granulocytes: 1 %
Lymphocytes Relative: 38 %
Lymphs Abs: 2.6 10*3/uL (ref 0.7–4.0)
MCH: 28.8 pg (ref 26.0–34.0)
MCHC: 33.4 g/dL (ref 30.0–36.0)
MCV: 86.3 fL (ref 80.0–100.0)
Monocytes Absolute: 0.3 10*3/uL (ref 0.1–1.0)
Monocytes Relative: 4 %
Neutro Abs: 3.8 10*3/uL (ref 1.7–7.7)
Neutrophils Relative %: 57 %
Platelet Count: 158 10*3/uL (ref 150–400)
RBC: 4.75 MIL/uL (ref 3.87–5.11)
RDW: 12.7 % (ref 11.5–15.5)
WBC Count: 6.8 10*3/uL (ref 4.0–10.5)
nRBC: 0 % (ref 0.0–0.2)
nRBC: 0 /100{WBCs}

## 2023-06-12 LAB — CMP (CANCER CENTER ONLY)
ALT: 20 U/L (ref 0–44)
AST: 25 U/L (ref 15–41)
Albumin: 4.3 g/dL (ref 3.5–5.0)
Alkaline Phosphatase: 62 U/L (ref 38–126)
Anion gap: 15 (ref 5–15)
BUN: 21 mg/dL (ref 8–23)
CO2: 24 mmol/L (ref 22–32)
Calcium: 9.7 mg/dL (ref 8.9–10.3)
Chloride: 102 mmol/L (ref 98–111)
Creatinine: 1.06 mg/dL — ABNORMAL HIGH (ref 0.44–1.00)
GFR, Estimated: 55 mL/min — ABNORMAL LOW (ref >60)
Glucose, Bld: 200 mg/dL — ABNORMAL HIGH (ref 70–99)
Potassium: 3.9 mmol/L (ref 3.5–5.1)
Sodium: 141 mmol/L (ref 135–145)
Total Bilirubin: 0.4 mg/dL (ref 0.0–1.2)
Total Protein: 6.6 g/dL (ref 6.5–8.1)

## 2023-06-12 LAB — TSH: TSH: 3.008 u[IU]/mL (ref 0.350–4.500)

## 2023-06-12 NOTE — Progress Notes (Signed)
 [facility]  [address] [place],  [place]  [address] [phone]  Clinic Day: 06/12/2023  Referring physician: Krystal Clark, NP   ASSESSMENT & PLAN:  Assessment: Recurrent Pancytopenia This was found in 2021 but corrected with B-12 injections. It then recurred and we increased her B-12 injections temporarily to raise her level. Since I don't think she is able to absorb B-12 through her GI tract, I recommended that she have injections twice monthly for 6 months and can go back to monthly. The fact that her B-12 level is only 339 while on monthly injections confirms my suspicion that her body isn't absorbing B-12 and that she will need to stay on injections lifelong, consistent with pernicious anemia.  Iron Deficiency She has been on oral supplement. I don't think a GI evaluation is neccessary at this time since she just had a colonoscopy and an EGD in 2023. However, she has a family history of colon cancer and previous tubular adenoma, so I agree with Dr. Urban Gibson recommendation of repeat every 3 years. In her case I would continue beyond age 6. She is taking occasional Aleve but denies any stomach upset and Dr. Chales Abrahams has her on Protonix. She can stop her iron supplement now.   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  She was seen again four years ago for pancytopenia and her B-12 level was low-normal even on injections of B-12, her level is just adequate so I don't think she is absorbing B-12, consistent with pernicious anemia. She was also found to be iron deficient and has been on oral iron supplement. Her pancytopenia has since then resolved. Patient had a colonoscopy and EGD 05/2021 by Dr. Chales Abrahams and he found a tubular  adenoma of the colon and gastric polyps which were benign. We recommend repeat colonoscopy every 3 years, especially with a positive family history of colon cancer. She informed me last July she developed retinal tears and was admitted into the ER and had surgery. She has a WBC of 6.8, hemoglobin of 13.7, and platelet count of 158,000. Her CMP is normal other than an elevated creatinine of 1.06 and glucose of 200. She notes eating sweets before her appointment today and her fasting glucose this morning was 113. I will add a TSH to her labs today. I will see her back in 1 year with CBC and CMP. The patient was provided an opportunity to ask questions and all were answered.  The patient agreed with the plan and demonstrated an understanding of the instructions. The patient was advised to call back if symptoms worsen.  I provided 16 minutes of face-to-face time during this this encounter and > 50% was spent counseling as documented under my assessment and plan.   Dellia Beckwith, MD [facility] [address] [place] [address] Dept: [phone] Dept Fax: [phone]   CHIEF COMPLAINT:  CC: Pancytopenia  Current Treatment: B-12 and Iron supplement  HISTORY OF PRESENT ILLNESS:  This is a 73 year old white female who I have seen previously for hairy cell leukemia. She was diagnosed in August of 1999, and treated with 1 cycle of cladribine. She has  never had any recurrence since that time. She was seen in March, 2021 for pancytopenia with a WBC of 3.6, ANC 1.9, hemoglobin 12.3 which dropped to 11.8, and platelet count of 122,000. Evaluation revealed a low normal B-12 level and she was placed on B-12 injections monthly. She has done well and her white count was up to 5.1 last year with a hemoglobin of 12.6 and platelet count of 159,000. She does have an history of colon polyps and had her last colonoscopy and EGD by Dr.  Chales Abrahams in February, 2023. Findings at that time include a tubular adenoma of the colon and gastric polyps which were benign. Random biopsies were normal. She does have a family history of colon cancer and so has this repeated every 3 years. She had her annual mammogram in January, 2024 and it revealed to be benign. 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   INTERVAL HISTORY:  Alivya is here today for follow-up for her hairy cell leukemia diagnosed in 1999. She was seen again 4 years ago for pancytopenia and her B-12 level was low-normal even on injections of B-12, her level is just adequate so I don't think she is absorbing B-12 consistent with pernicious anemia. She was also found to be iron deficient. Her pancytopenia has since then resolved. Patient had a colonoscopy and EGD 05/2021 by Dr. Chales Abrahams and he found a tubular adenoma of the colon and gastric polyps which were benign. We recommend repeat colonoscopy every 3 years, especially with a positive family history of colon cancer. Patient states that she feels well but complains of fatigue. She informed me last July she developed retinal tears and was admitted to the ER and had surgery. She has a WBC of 6.8, hemoglobin of 13.7, and platelet count of 158,000. Her CMP is normal other than a slightly elevated creatinine of 1.06 and glucose of 200. She notes eating sweets before her appointment today and her fasting glucose this morning was 113. I will add a TSH to her labs today. I will see her back in 1 year with CBC and CMP. She denies signs of infection such as sore throat, sinus drainage, cough, or urinary symptoms.  She denies fevers or recurrent chills. She denies pain. She denies nausea, vomiting, chest pain, dyspnea or cough. Her appetite is ok and her weight has decreased 8 pounds over last 6 months .   REVIEW OF SYSTEMS:  Review of Systems  Constitutional:  Positive for  fatigue (mild). Negative for appetite change, chills, diaphoresis, fever and unexpected weight change.  HENT:   Negative for hearing loss, lump/mass, mouth sores, nosebleeds, sore throat, tinnitus, trouble swallowing and voice change.   Eyes: Negative.  Negative for eye problems and icterus.  Respiratory: Negative.  Negative for chest tightness, cough, hemoptysis, shortness of breath and wheezing.   Cardiovascular: Negative.  Negative for chest pain, leg swelling and palpitations.  Gastrointestinal: Negative.  Negative for abdominal distention, abdominal pain, blood in stool, constipation, diarrhea, nausea, rectal pain and vomiting.  Endocrine: Negative.   Genitourinary: Negative.  Negative for bladder incontinence, difficulty urinating, dyspareunia, dysuria, frequency, hematuria, menstrual problem, nocturia, pelvic pain, vaginal bleeding and vaginal discharge.   Musculoskeletal:  Negative for arthralgias, back pain, flank pain, gait problem, myalgias, neck pain and neck stiffness.  Skin: Negative.  Negative for itching, rash and wound.  Neurological:  Negative for dizziness, extremity weakness, gait  problem, headaches, light-headedness, numbness, seizures and speech difficulty.  Hematological: Negative.  Negative for adenopathy. Does not bruise/bleed easily.  Psychiatric/Behavioral: Negative.  Negative for confusion, decreased concentration, depression, sleep disturbance and suicidal ideas. The patient is not nervous/anxious.   All other systems reviewed and are negative.  VITALS:   Vitals:   06/12/23 1412  BP: 138/80  Pulse: 98  Resp: 18  Temp: 98 F (36.7 C)  SpO2: 99%   Wt Readings from Last 3 Encounters:  06/23/23 198 lb 6.4 oz (90 kg)  06/12/23 197 lb 6.4 oz (89.5 kg)  12/16/22 205 lb 11.2 oz (93.3 kg)   PHYSICAL EXAM:  Physical Exam Vitals and nursing note reviewed.  Constitutional:      General: She is not in acute distress.    Appearance: Normal appearance. She is normal  weight. She is not ill-appearing, toxic-appearing or diaphoretic.  HENT:     Head: Normocephalic and atraumatic.     Right Ear: Tympanic membrane, ear canal and external ear normal. There is no impacted cerumen.     Left Ear: Tympanic membrane, ear canal and external ear normal. There is no impacted cerumen.     Nose: Nose normal. No congestion or rhinorrhea.     Mouth/Throat:     Mouth: Mucous membranes are moist.     Pharynx: Oropharynx is clear. No oropharyngeal exudate or posterior oropharyngeal erythema.  Eyes:     General: No scleral icterus.       Right eye: No discharge.        Left eye: No discharge.     Extraocular Movements: Extraocular movements intact.     Conjunctiva/sclera: Conjunctivae normal.     Pupils: Pupils are equal, round, and reactive to light.  Neck:     Vascular: No carotid bruit.  Cardiovascular:     Rate and Rhythm: Normal rate and regular rhythm.     Pulses: Normal pulses.     Heart sounds: Normal heart sounds. No murmur heard.    No friction rub. No gallop.  Pulmonary:     Effort: Pulmonary effort is normal. No respiratory distress.     Breath sounds: Normal breath sounds. No stridor. No wheezing, rhonchi or rales.  Chest:     Chest wall: No tenderness.  Abdominal:     General: Bowel sounds are normal. There is no distension.     Palpations: Abdomen is soft. There is no hepatomegaly, splenomegaly or mass.     Tenderness: There is no abdominal tenderness. There is no right CVA tenderness, left CVA tenderness, guarding or rebound.     Hernia: No hernia is present.  Musculoskeletal:        General: No swelling, tenderness, deformity or signs of injury. Normal range of motion.     Cervical back: Normal range of motion and neck supple. No rigidity or tenderness.     Right lower leg: No edema.     Left lower leg: No edema.  Lymphadenopathy:     Cervical: No cervical adenopathy.     Right cervical: No superficial, deep or posterior cervical adenopathy.     Left cervical: No superficial, deep or posterior cervical adenopathy.     Upper Body:     Right upper body: No supraclavicular, axillary or pectoral adenopathy.     Left upper body: No supraclavicular, axillary or pectoral adenopathy.  Skin:    General: Skin is warm and dry.     Coloration: Skin is not jaundiced or pale.  Findings: Ecchymosis and petechiae present. No bruising, erythema, lesion or rash.     Comments: Several ecchymosis and petechiae   Neurological:     General: No focal deficit present.     Mental Status: She is alert and oriented to person, place, and time. Mental status is at baseline.     Cranial Nerves: No cranial nerve deficit.     Sensory: No sensory deficit.     Motor: No weakness.     Coordination: Coordination normal.     Gait: Gait normal.     Deep Tendon Reflexes: Reflexes normal.  Psychiatric:        Mood and Affect: Mood normal.        Behavior: Behavior normal.        Thought Content: Thought content normal.        Judgment: Judgment normal.    HISTORY:  Medical History Past Medical History:  Diagnosis Date   Anxiety    Cancer (HCC)    leukemia   Carotid artery occlusion    Essential hypertension    Family history of colon cancer    GERD (gastroesophageal reflux disease)    with chronic cough   History of colon polyps    History of GI bleed    History of hemorrhoids    Mixed hyperlipidemia    Nuclear sclerosis of both eyes    Obesity    Primary osteoarthritis involving multiple joints    Vitamin D deficiency     Surgical History Past Surgical History:  Procedure Laterality Date   APPENDECTOMY     CHOLECYSTECTOMY  2006   laparoscopic due to stones   COLONOSCOPY  08/27/2009   Mild pancolonic diverticulosis. Small internal hemorrhoids.   COLONOSCOPY     ESOPHAGOGASTRODUODENOSCOPY  08/27/2009   Antral polyp status post polypectomy. Mild gastritis   UPPER GASTROINTESTINAL ENDOSCOPY     VAGINAL HYSTERECTOMY      Family  History Family History  Problem Relation Age of Onset   COPD Mother    Diabetes Mother    Heart disease Mother    COPD Father    Colon cancer Sister    Rectal cancer Neg Hx    Stomach cancer Neg Hx    Esophageal cancer Neg Hx    Social History Social History   Substance and Sexual Activity  Alcohol Use Not Currently    Social History   Substance and Sexual Activity  Drug Use Never   Social History   Tobacco Use  Smoking Status Former   Types: Cigarettes  Smokeless Tobacco Never   LABS:   Lab Results  Component Value Date   WBC 6.8 06/12/2023   HGB 13.7 06/12/2023   HCT 41.0 06/12/2023   MCV 86.3 06/12/2023   PLT 158 06/12/2023   Lab Results  Component Value Date   CREATININE 1.06 (H) 06/12/2023   BUN 21 06/12/2023   NA 141 06/12/2023   K 3.9 06/12/2023   CL 102 06/12/2023   CO2 24 06/12/2023      Component Value Date/Time   PROT 6.6 06/12/2023 1319   ALBUMIN 4.3 06/12/2023 1319   AST 25 06/12/2023 1319   ALT 20 06/12/2023 1319   ALKPHOS 62 06/12/2023 1319   BILITOT 0.4 06/12/2023 1319   Lab Results  Component Value Date   TSH 3.008 06/12/2023   No results found for: "FERRITIN" No results found for: "IRON", "TIBC", "FERRITIN"  STUDIES:  EXAM: 03/29/2024 DIGITAL SCREENING BILATERAL MAMMOGRAM WITH TOMOSYNTHESIS AND CAD  IMPRESSION: No mammographic evidence of malignancy.     I,Jasmine M Lassiter,acting as a scribe for Dellia Beckwith, MD.,have documented all relevant documentation on the behalf of Dellia Beckwith, MD,as directed by  Dellia Beckwith, MD while in the presence of Dellia Beckwith, MD.

## 2023-06-22 NOTE — Progress Notes (Unsigned)
 Patient name: Judith Matthews MRN: [id number] DOB: 1950/08/18 Sex: female  REASON FOR CONSULT: 69-month follow-up, carotid artery surveillance  HPI: Judith Matthews is a 73 y.o. female, with history of leukemia, hypertension, hyperlipidemia, DM that presents for 52-month follow-up for carotid artery surveillance.  Previously seen when she had a "spider webbing" feeling that happened acutely on 10/31/2022.  She was instructed to go to the ED.  Patient was seen in the ED.  She had an MRI brain on 10/31/2022 with no acute changes.  CTA neck on 10/31/2022 showed moderate right ICA siphon stenosis but no significant stenosis in the extracranial carotid arteries.  Further evaluation by ophthalmology found she had 3 retinal tears in the right eye.  These have since been repaired with surgery with improvement in her clinical symptoms.     Now presents for 55-month follow-up of her carotid artery disease.  Past Medical History:  Diagnosis Date   Anxiety    Cancer (HCC)    leukemia   Essential hypertension    Family history of colon cancer    GERD (gastroesophageal reflux disease)    with chronic cough   History of colon polyps    History of GI bleed    History of hemorrhoids    Mixed hyperlipidemia    Nuclear sclerosis of both eyes    Obesity    Primary osteoarthritis involving multiple joints    Vitamin D deficiency     Past Surgical History:  Procedure Laterality Date   APPENDECTOMY     CHOLECYSTECTOMY  2006   laparoscopic due to stones   COLONOSCOPY  08/27/2009   Mild pancolonic diverticulosis. Small internal hemorrhoids.   COLONOSCOPY     ESOPHAGOGASTRODUODENOSCOPY  08/27/2009   Antral polyp status post polypectomy. Mild gastritis   UPPER GASTROINTESTINAL ENDOSCOPY     VAGINAL HYSTERECTOMY      Family History  Problem Relation Age of Onset   COPD Mother    Diabetes Mother    Heart disease Mother    COPD Father    Colon cancer Sister    Rectal cancer Neg Hx    Stomach cancer  Neg Hx    Esophageal cancer Neg Hx     SOCIAL HISTORY: Social History   Socioeconomic History   Marital status: Married    Spouse name: Not on file   Number of children: Not on file   Years of education: Not on file   Highest education level: Not on file  Occupational History   Occupation: retired  Tobacco Use   Smoking status: Former    Types: Cigarettes   Smokeless tobacco: Never  Vaping Use   Vaping status: Never Used  Substance and Sexual Activity   Alcohol use: Not Currently   Drug use: Never   Sexual activity: Not on file  Other Topics Concern   Not on file  Social History Narrative   Not on file   Social Drivers of Health   Financial Resource Strain: Not on file  Food Insecurity: Low Risk  (02/04/2023)   Received from [facility]   Hunger Vital Sign    Worried About Running Out of Food in the Last Year: Never true    Ran Out of Food in the Last Year: Never true  Transportation Needs: No Transportation Needs (02/04/2023)   Received from [facility]    In the past 12 months, has lack of reliable transportation kept you from medical appointments, meetings, work or  from getting things needed for daily living? : No  Physical Activity: Not on file  Stress: Not on file  Social Connections: Not on file  Intimate Partner Violence: Not on file    Allergies  Allergen Reactions   Senna Other (See Comments)   Senna-Lax     REACTION: nausea, vomiting    Current Outpatient Medications  Medication Sig Dispense Refill   ALPRAZolam (XANAX) 0.5 MG tablet Take 0.5 mg by mouth once as needed for anxiety.     aspirin EC 81 MG tablet Take 81 mg by mouth daily. Swallow whole.     atorvastatin (LIPITOR) 10 MG tablet Take 10 mg by mouth daily.     celecoxib (CELEBREX) 200 MG capsule Take by mouth daily.     clobetasol ointment (TEMOVATE) 0.05 % Apply 1 Application topically as needed (inflammation).     Coenzyme Q10 (CO Q 10 PO) Take by mouth.      cyanocobalamin (,VITAMIN B-12,) 1000 MCG/ML injection Inject into the muscle.     dexamethasone 0.5 MG/5ML elixir Take by mouth as needed.     ergocalciferol (VITAMIN D2) 1.25 MG (50000 UT) capsule TAKE 1 CAPSULE BY MOUTH TWICE A WEEK     losartan (COZAAR) 50 MG tablet Take 2 tablets by mouth daily.     metFORMIN (GLUCOPHAGE-XR) 500 MG 24 hr tablet Take 500 mg by mouth 2 (two) times daily.     Omega-3 Fatty Acids (FISH OIL PO) Take by mouth 2 (two) times daily.     pantoprazole (PROTONIX) 40 MG tablet Take 1 tablet (40 mg total) by mouth daily. 90 tablet 1   sitaGLIPtin (JANUVIA) 100 MG tablet Take 1 tablet by mouth daily.     No current facility-administered medications for this visit.    REVIEW OF SYSTEMS:  [X]  denotes positive finding, [ ]  denotes negative finding Cardiac  Comments:  Chest pain or chest pressure:    Shortness of breath upon exertion:    Short of breath when lying flat:    Irregular heart rhythm:        Vascular    Pain in calf, thigh, or hip brought on by ambulation:    Pain in feet at night that wakes you up from your sleep:     Blood clot in your veins:    Leg swelling:         Pulmonary    Oxygen at home:    Productive cough:     Wheezing:         Neurologic    Sudden weakness in arms or legs:     Sudden numbness in arms or legs:     Sudden onset of difficulty speaking or slurred speech:    Temporary loss of vision in one eye:     Problems with dizziness:         Gastrointestinal    Blood in stool:     Vomited blood:         Genitourinary    Burning when urinating:     Blood in urine:        Psychiatric    Major depression:         Hematologic    Bleeding problems:    Problems with blood clotting too easily:        Skin    Rashes or ulcers:        Constitutional    Fever or chills:      PHYSICAL EXAM: There were no  vitals filed for this visit.  GENERAL: The patient is a well-nourished female, in no acute distress. The vital signs  are documented above. CARDIAC: There is a regular rate and rhythm.  VASCULAR:  No previous neck incisions PULMONARY: No respiratory distress. ABDOMEN: Soft and non-tender. MUSCULOSKELETAL: There are no major deformities or cyanosis. NEUROLOGIC: No focal weakness or paresthesias are detected.  CN II-XII grossly intact. SKIN: There are no ulcers or rashes noted. PSYCHIATRIC: The patient has a normal affect.  DATA:   Ultrasound carotid  CTA neck reviewed from 10/31/2022 with some moderate plaque in the right carotid bifurcation that appears less than 50% and minimal plaque in the left carotid bifurcation  Assessment/Plan:  73 y.o. female, with history of leukemia, hypertension, hyperlipidemia, DM that presents for 22-month follow-up for carotid artery surveillance.  She had vision loss last year on 10/31/2022 that was found to be associated with 3 retinal tears and has since undergone surgery with improvement.  Previously discussed we did not feel this represented a TIA or stroke and she had negative MRI for stroke workup.  There was moderate right ICA siphon stenosis but we recommended medical management.  She had some moderate plaque in the right carotid bifurcation less than 50%.  Carotid duplex today    Cephus Shelling, MD [facility]: [phone]

## 2023-06-23 ENCOUNTER — Other Ambulatory Visit: Payer: Self-pay | Admitting: Gastroenterology

## 2023-06-23 ENCOUNTER — Ambulatory Visit (HOSPITAL_COMMUNITY)
Admission: RE | Admit: 2023-06-23 | Discharge: 2023-06-23 | Disposition: A | Discharge status: Home / Self Care | Payer: PPO | Source: Ambulatory Visit | Attending: Vascular Surgery | Admitting: Vascular Surgery

## 2023-06-23 ENCOUNTER — Ambulatory Visit: Payer: PPO | Admitting: Vascular Surgery

## 2023-06-23 ENCOUNTER — Encounter: Payer: Self-pay | Admitting: Vascular Surgery

## 2023-06-23 VITALS — BP 123/85 | HR 75 | Temp 98.0°F | Resp 18 | Ht 63.5 in | Wt 198.4 lb

## 2023-06-23 DIAGNOSIS — I6523 Occlusion and stenosis of bilateral carotid arteries: Secondary | ICD-10-CM

## 2023-06-23 IMAGING — MG MM DIGITAL SCREENING BILAT W/ TOMO AND CAD
8 series · 8 of 24 positions shown · non-contrast
Comparison: Previous exam(s).

CLINICAL DATA: Screening.

EXAM:
DIGITAL SCREENING BILATERAL MAMMOGRAM WITH TOMOSYNTHESIS AND CAD
TECHNIQUE: Bilateral screening digital craniocaudal and mediolateral oblique
mammograms were obtained. Bilateral screening digital breast
tomosynthesis was performed. The images were evaluated with
computer-aided detection.

[L MLO synth-2D]
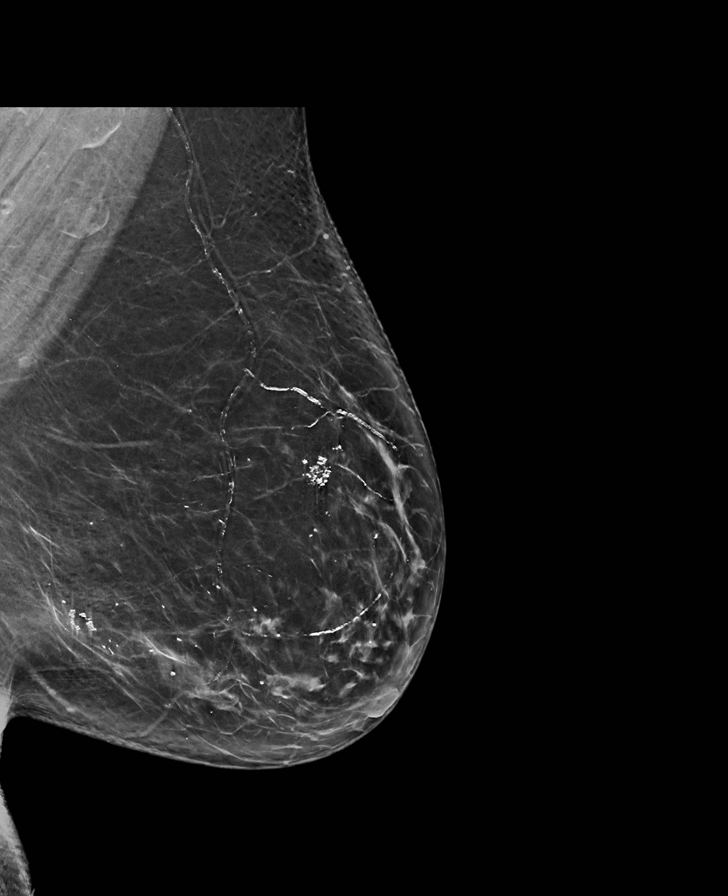

[R MLO synth-2D]
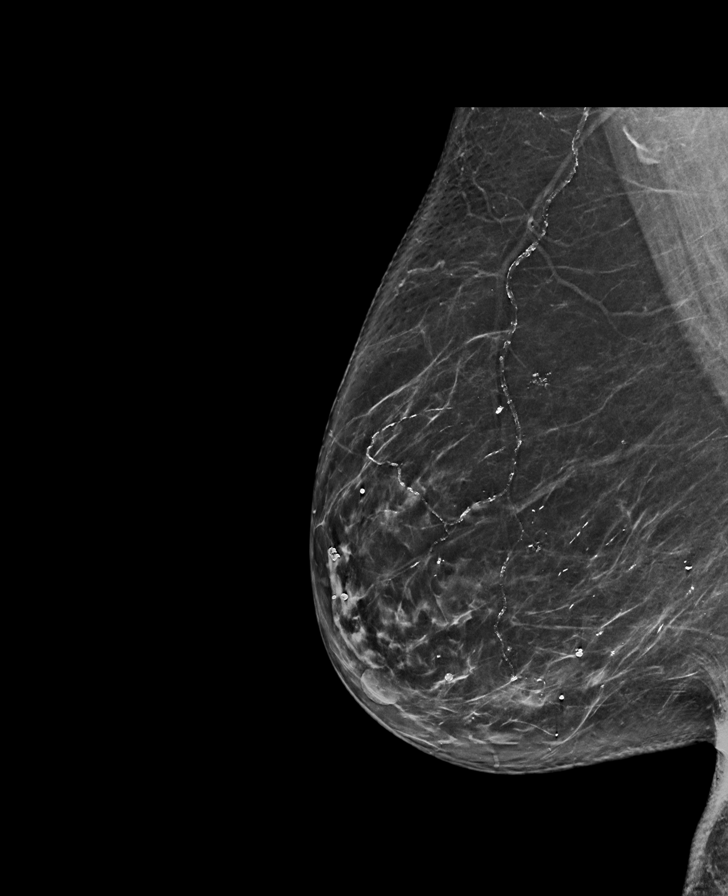

[R CC synth-2D]
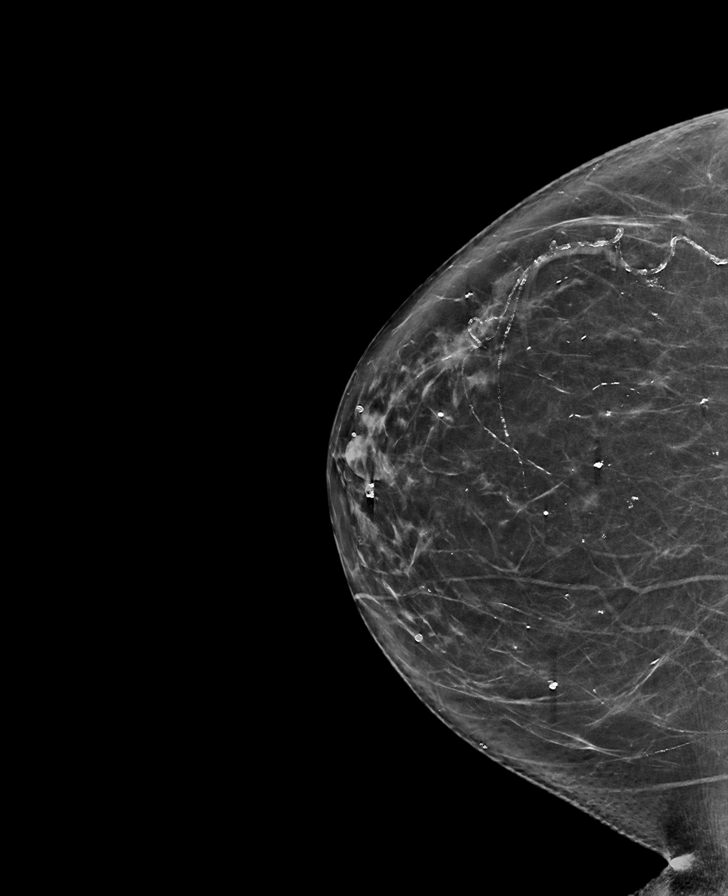

[L CC synth-2D]
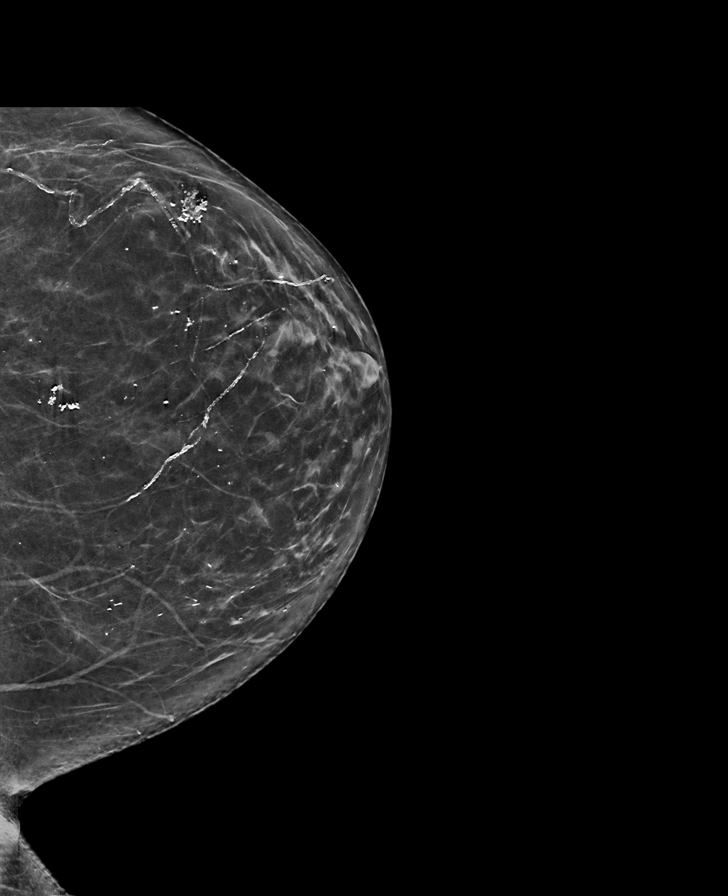

[R CC tomo · tomo slice 39/76.0]
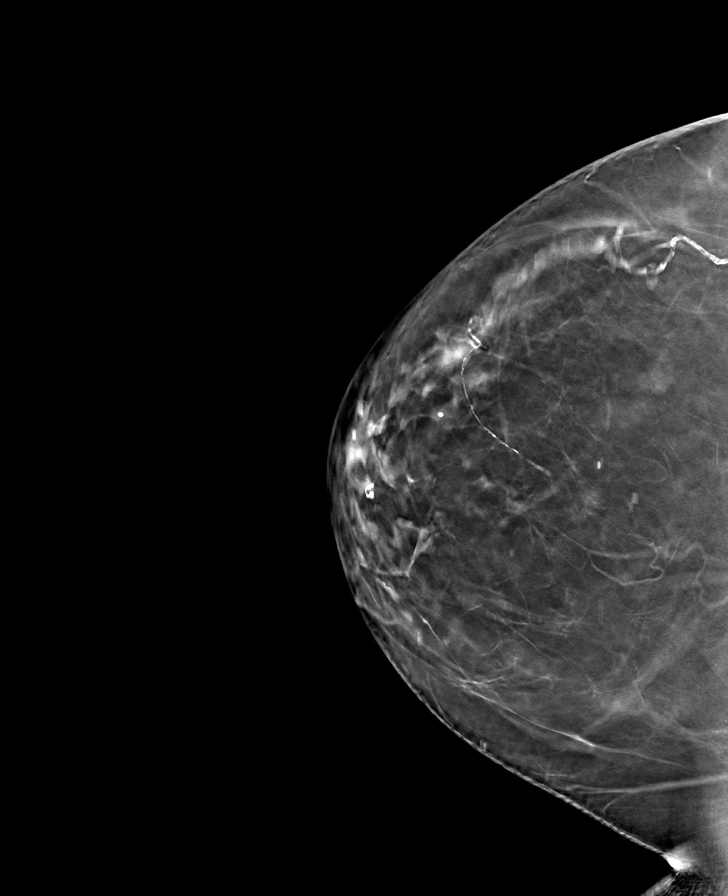

[L CC tomo · tomo slice 37/74.0]
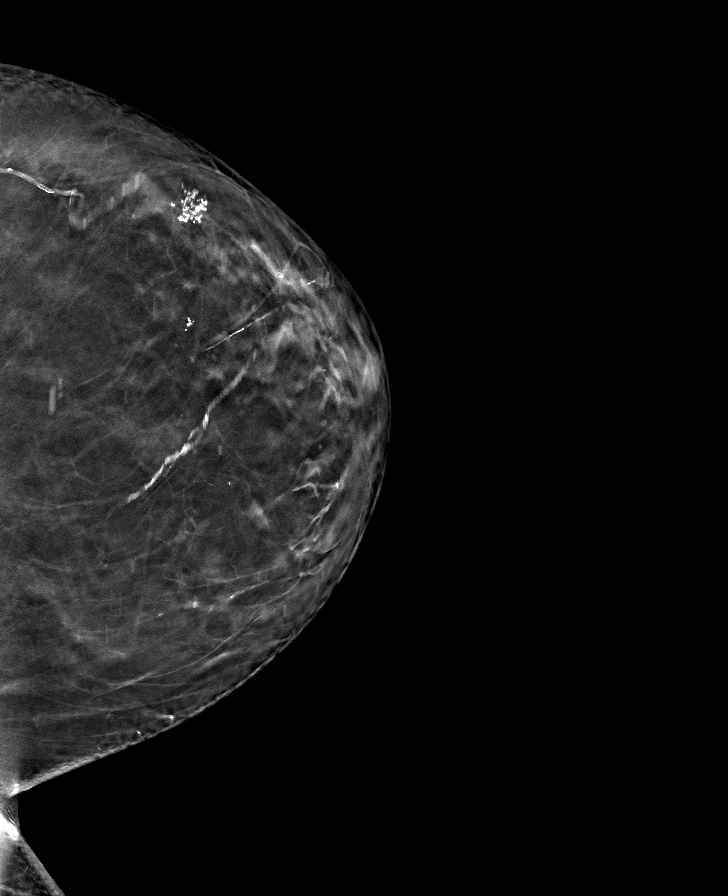

[R MLO tomo · tomo slice 43/86.0]
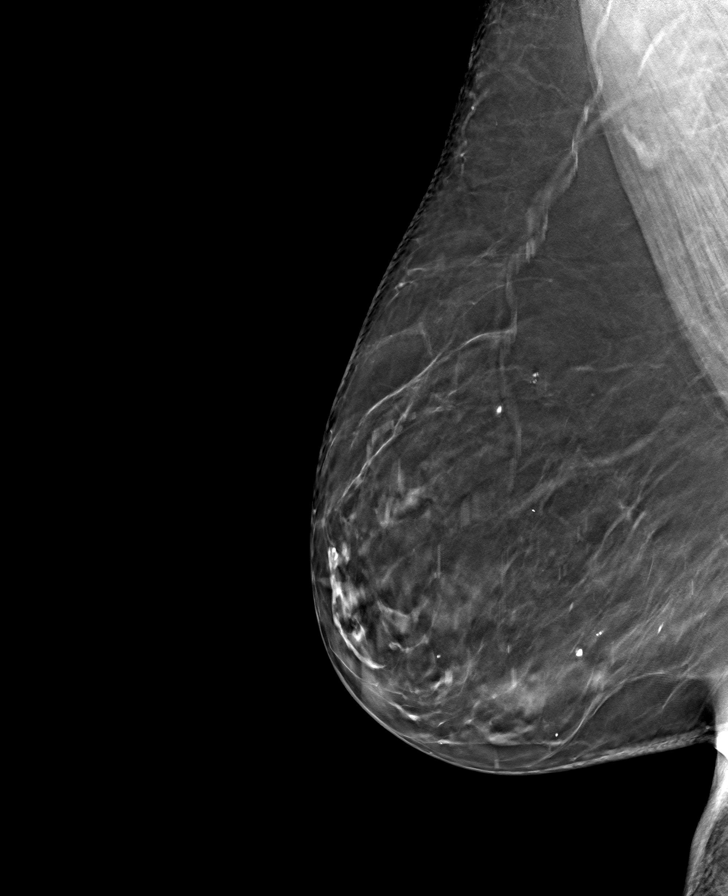

[L MLO tomo · tomo slice 45/89.0]
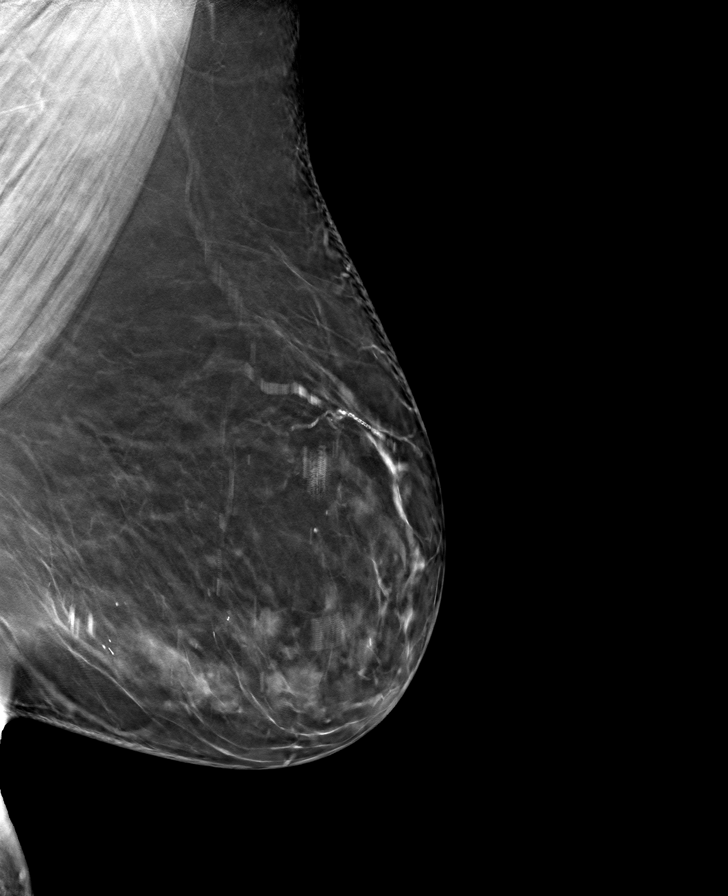

[8 of 24 positions shown; findings below may reference images not displayed]

ACR Breast Density Category b: There are scattered areas of
fibroglandular density.
FINDINGS: There are no findings suspicious for malignancy.
IMPRESSION: No mammographic evidence of malignancy. A result letter of this
screening mammogram will be mailed directly to the patient.

RECOMMENDATION:
Screening mammogram in one year. (Code:51-O-LD2)

BI-RADS CATEGORY  1: Negative.

## 2023-09-16 ENCOUNTER — Other Ambulatory Visit: Payer: Self-pay | Admitting: Specialist

## 2023-09-16 DIAGNOSIS — Z1231 Encounter for screening mammogram for malignant neoplasm of breast: Secondary | ICD-10-CM

## 2023-10-02 ENCOUNTER — Other Ambulatory Visit: Payer: Self-pay | Admitting: Gastroenterology

## 2024-03-14 ENCOUNTER — Other Ambulatory Visit: Payer: Self-pay

## 2024-03-14 ENCOUNTER — Ambulatory Visit: Admitting: Orthopaedic Surgery

## 2024-03-14 VITALS — Wt 200.0 lb

## 2024-03-14 DIAGNOSIS — G8929 Other chronic pain: Secondary | ICD-10-CM

## 2024-03-14 NOTE — Progress Notes (Signed)
 The patient is a 73 year old active female that I have seen in the past but it has been a while.  She comes in today with bilateral knee achiness but not really true pain.  She reports that they just do not work well.  She says sometimes they give out and stairs are impossible for her at times.  She says the pain is not bad though.  It does not wake her up at night.  Long trips do bother her knees.  She is also reporting right sided sciatica.  She denies any hip or groin pain.  Both knees have patellofemoral crepitation and medial joint line tenderness.  Both knees have varus malalignment that is correctable.  Both knees have pain throughout the arc motion of exam but the pain is only mild and there is no instability of either knee on exam.  Both hips move smoothly and fluidly.  She does have a component of right sided sciatica.  X-rays of the right knee and left knee showed significant tricompartment arthritis of the right worse than left.  Both knees have near bone-on-bone wear of the medial compartment and patellofemoral joint and osteophytes as well.  Since she is not hurting significantly, I do feel that she would benefit from outpatient physical therapy for any modalities that can strengthen the muscles around her knees and work on her right sided sciatica.  She agrees with trying this type of treatment plan since she is not really hurting bad.  We agree with this as well.  We will give her a prescription for outpatient physical therapy and then see her back in 6 weeks to see how she is doing overall.  She is a diabetic but has a hemoglobin A1c of the low 6 range.

## 2024-04-01 ENCOUNTER — Ambulatory Visit
Admission: RE | Admit: 2024-04-01 | Discharge: 2024-04-01 | Disposition: A | Discharge status: Home / Self Care | Source: Ambulatory Visit | Attending: Specialist | Admitting: Specialist

## 2024-04-01 DIAGNOSIS — Z1231 Encounter for screening mammogram for malignant neoplasm of breast: Secondary | ICD-10-CM

## 2024-04-05 ENCOUNTER — Encounter: Payer: Self-pay | Admitting: Gastroenterology

## 2024-04-25 ENCOUNTER — Encounter: Payer: Self-pay | Admitting: Orthopaedic Surgery

## 2024-04-25 ENCOUNTER — Ambulatory Visit: Admitting: Orthopaedic Surgery

## 2024-04-25 DIAGNOSIS — G8929 Other chronic pain: Secondary | ICD-10-CM | POA: Diagnosis not present

## 2024-04-25 DIAGNOSIS — M1712 Unilateral primary osteoarthritis, left knee: Secondary | ICD-10-CM | POA: Insufficient documentation

## 2024-04-25 DIAGNOSIS — M25561 Pain in right knee: Secondary | ICD-10-CM

## 2024-04-25 DIAGNOSIS — M25562 Pain in left knee: Secondary | ICD-10-CM | POA: Diagnosis not present

## 2024-04-25 DIAGNOSIS — M1711 Unilateral primary osteoarthritis, right knee: Secondary | ICD-10-CM | POA: Diagnosis not present

## 2024-04-25 NOTE — Progress Notes (Signed)
 The is a 74 year old female well-known to us .  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  She has had steroid injections in both knees and now physical therapy.  It is gotten to the point where her knee pain is daily and it is detrimentally affecting her mobility, her quality of life and her actives daily living.  Physical therapy was helpful with her mobility but at this point she does wish to proceed with a knee replaced on the right side.  She is diabetic but has a low hemoglobin A1c.  She is otherwise active and healthy.  She is not morbidly obese.  I was able to review all of her medications and past medical history in epic.  We have seen her for many years now.  On exam her right knee has pain throughout the arc of motion of the knee.  There is slight varus malalignment.  It is ligamentously stable.  X-rays from last month of the right knee shows varus malalignment with bone-on-bone wear of all 3 compartments and osteophytes.  We had a long and thorough discussion about knee replacement surgery.  I discussed the risks and benefits of the surgery and what to expect from an intraoperative and postoperative standpoint and through the postoperative standpoint of recovery.  I showed her knee replacement model and went over the details of the surgery.  All questions and concerns were answered and addressed.  She hopes to have this right knee replaced in surgery sometime in March of this year.  We will be in touch with scheduling surgery.  He is welcome to reach out anytime if other questions need to be answered for her.

## 2024-05-06 ENCOUNTER — Telehealth: Payer: Self-pay

## 2024-05-06 NOTE — Telephone Encounter (Signed)
 I called patient to discuss scheduling TKA.  Left voice mail message for return call.

## 2024-05-17 ENCOUNTER — Ambulatory Visit: Admitting: Gastroenterology

## 2024-06-10 ENCOUNTER — Ambulatory Visit: Admitting: Oncology

## 2024-06-10 ENCOUNTER — Other Ambulatory Visit
